# Patient Record
Sex: Female | Born: 1965 | Race: Black or African American | Hispanic: No | Marital: Single | Smoking: Never smoker
Health system: Southern US, Community
[De-identification: ages and names within clinical notes are randomized; demographics above are authoritative.]

## PROBLEM LIST (undated history)

## (undated) DIAGNOSIS — T7840XA Allergy, unspecified, initial encounter: Secondary | ICD-10-CM

## (undated) DIAGNOSIS — I10 Essential (primary) hypertension: Secondary | ICD-10-CM

## (undated) DIAGNOSIS — R87619 Unspecified abnormal cytological findings in specimens from cervix uteri: Secondary | ICD-10-CM

## (undated) HISTORY — DX: Allergy, unspecified, initial encounter: T78.40XA

## (undated) HISTORY — DX: Essential (primary) hypertension: I10

## (undated) HISTORY — DX: Unspecified abnormal cytological findings in specimens from cervix uteri: R87.619

---

## 1998-07-11 ENCOUNTER — Ambulatory Visit (HOSPITAL_COMMUNITY): Admission: RE | Admit: 1998-07-11 | Discharge: 1998-07-11 | Payer: Self-pay | Admitting: Family Medicine

## 2000-02-06 ENCOUNTER — Other Ambulatory Visit: Admission: RE | Admit: 2000-02-06 | Discharge: 2000-02-06 | Payer: Self-pay | Admitting: Gynecology

## 2001-02-21 ENCOUNTER — Other Ambulatory Visit: Admission: RE | Admit: 2001-02-21 | Discharge: 2001-02-21 | Payer: Self-pay | Admitting: Gynecology

## 2002-03-26 ENCOUNTER — Other Ambulatory Visit: Admission: RE | Admit: 2002-03-26 | Discharge: 2002-03-26 | Payer: Self-pay | Admitting: Family Medicine

## 2003-08-25 ENCOUNTER — Other Ambulatory Visit: Admission: RE | Admit: 2003-08-25 | Discharge: 2003-08-25 | Payer: Self-pay | Admitting: Family Medicine

## 2004-08-23 ENCOUNTER — Ambulatory Visit: Payer: Self-pay | Admitting: Family Medicine

## 2004-08-29 ENCOUNTER — Ambulatory Visit: Payer: Self-pay | Admitting: Family Medicine

## 2004-08-29 ENCOUNTER — Other Ambulatory Visit: Admission: RE | Admit: 2004-08-29 | Discharge: 2004-08-29 | Payer: Self-pay | Admitting: Family Medicine

## 2005-12-25 ENCOUNTER — Encounter: Payer: Self-pay | Admitting: Family Medicine

## 2005-12-25 ENCOUNTER — Ambulatory Visit: Payer: Self-pay | Admitting: Family Medicine

## 2005-12-25 ENCOUNTER — Other Ambulatory Visit: Admission: RE | Admit: 2005-12-25 | Discharge: 2005-12-25 | Payer: Self-pay | Admitting: Family Medicine

## 2008-04-08 ENCOUNTER — Telehealth: Payer: Self-pay | Admitting: Family Medicine

## 2008-04-09 ENCOUNTER — Ambulatory Visit: Payer: Self-pay | Admitting: Family Medicine

## 2008-04-09 DIAGNOSIS — I10 Essential (primary) hypertension: Secondary | ICD-10-CM | POA: Insufficient documentation

## 2008-05-26 ENCOUNTER — Telehealth: Payer: Self-pay | Admitting: Family Medicine

## 2008-06-18 ENCOUNTER — Telehealth: Payer: Self-pay | Admitting: Family Medicine

## 2008-06-22 ENCOUNTER — Ambulatory Visit: Payer: Self-pay | Admitting: Family Medicine

## 2008-11-18 ENCOUNTER — Ambulatory Visit: Payer: Self-pay | Admitting: Family Medicine

## 2008-11-18 ENCOUNTER — Other Ambulatory Visit: Admission: RE | Admit: 2008-11-18 | Discharge: 2008-11-18 | Payer: Self-pay | Admitting: Family Medicine

## 2008-11-18 ENCOUNTER — Encounter: Payer: Self-pay | Admitting: Family Medicine

## 2008-11-18 DIAGNOSIS — B373 Candidiasis of vulva and vagina: Secondary | ICD-10-CM

## 2008-11-18 DIAGNOSIS — J019 Acute sinusitis, unspecified: Secondary | ICD-10-CM

## 2008-11-26 ENCOUNTER — Encounter: Payer: Self-pay | Admitting: Family Medicine

## 2008-11-26 LAB — CONVERTED CEMR LAB
Alkaline Phosphatase: 70 units/L (ref 39–117)
Bilirubin, Direct: 0.1 mg/dL (ref 0.0–0.3)
CO2: 0 meq/L — CL (ref 19–32)
Chloride: 112 meq/L (ref 96–112)
GFR calc Af Amer: 142 mL/min
Glucose, Bld: 145 mg/dL — ABNORMAL HIGH (ref 70–99)
HDL: 55.3 mg/dL (ref >39.0)
LDL Cholesterol: 98 mg/dL (ref 0–99)
Lymphocytes Relative: 30.6 % (ref 12.0–46.0)
Monocytes Absolute: 0.5 10*3/uL (ref 0.1–1.0)
Monocytes Relative: 10.1 % (ref 3.0–12.0)
Neutrophils Relative %: 56.4 % (ref 43.0–77.0)
Platelets: 221 10*3/uL (ref 150–400)
Potassium: 3.5 meq/L (ref 3.5–5.1)
RDW: 12 % (ref 11.5–14.6)
Sodium: 142 meq/L (ref 135–145)
Total CHOL/HDL Ratio: 3.3
Total Protein: 6.8 g/dL (ref 6.0–8.3)
Triglycerides: 156 mg/dL — ABNORMAL HIGH (ref 0–149)
VLDL: 31 mg/dL (ref 0–40)

## 2009-02-03 ENCOUNTER — Telehealth: Payer: Self-pay | Admitting: Family Medicine

## 2009-02-17 ENCOUNTER — Encounter: Payer: Self-pay | Admitting: Family Medicine

## 2009-02-26 ENCOUNTER — Encounter: Payer: Self-pay | Admitting: Family Medicine

## 2009-07-18 ENCOUNTER — Encounter: Payer: Self-pay | Admitting: Family Medicine

## 2009-09-26 ENCOUNTER — Ambulatory Visit (HOSPITAL_COMMUNITY): Admission: RE | Admit: 2009-09-26 | Discharge: 2009-09-26 | Payer: Self-pay | Admitting: Orthopedic Surgery

## 2009-10-17 HISTORY — PX: FOOT SURGERY: SHX648

## 2009-11-17 ENCOUNTER — Telehealth: Payer: Self-pay | Admitting: Family Medicine

## 2009-12-23 ENCOUNTER — Ambulatory Visit: Payer: Self-pay | Admitting: Family Medicine

## 2009-12-26 LAB — CONVERTED CEMR LAB
AST: 40 units/L — ABNORMAL HIGH (ref 0–37)
Albumin: 3.4 g/dL — ABNORMAL LOW (ref 3.5–5.2)
Basophils Absolute: 0 10*3/uL (ref 0.0–0.1)
Calcium: 9.4 mg/dL (ref 8.4–10.5)
Chloride: 109 meq/L (ref 96–112)
Creatinine, Ser: 0.6 mg/dL (ref 0.4–1.2)
Eosinophils Absolute: 0.2 10*3/uL (ref 0.0–0.7)
Ketones, ur: NEGATIVE mg/dL
LDL Cholesterol: 99 mg/dL (ref 0–99)
Leukocytes, UA: NEGATIVE
Lymphs Abs: 1.9 10*3/uL (ref 0.7–4.0)
MCHC: 33.1 g/dL (ref 30.0–36.0)
MCV: 94.2 fL (ref 78.0–100.0)
Monocytes Absolute: 0.8 10*3/uL (ref 0.1–1.0)
Neutro Abs: 3.4 10*3/uL (ref 1.4–7.7)
Nitrite: NEGATIVE
Platelets: 238 10*3/uL (ref 150.0–400.0)
RBC: 3.52 M/uL — ABNORMAL LOW (ref 3.87–5.11)
RDW: 12.6 % (ref 11.5–14.6)
Specific Gravity, Urine: 1.02 (ref 1.000–1.030)
TSH: 1.91 microintl units/mL (ref 0.35–5.50)
Total Bilirubin: 0.4 mg/dL (ref 0.3–1.2)
Total CHOL/HDL Ratio: 3
Triglycerides: 115 mg/dL (ref 0.0–149.0)
Urobilinogen, UA: 0.2 (ref 0.0–1.0)
WBC: 6.3 10*3/uL (ref 4.5–10.5)
pH: 6 (ref 5.0–8.0)

## 2009-12-27 ENCOUNTER — Ambulatory Visit: Payer: Self-pay | Admitting: Family Medicine

## 2009-12-27 ENCOUNTER — Other Ambulatory Visit: Admission: RE | Admit: 2009-12-27 | Discharge: 2009-12-27 | Payer: Self-pay | Admitting: Family Medicine

## 2009-12-27 DIAGNOSIS — E119 Type 2 diabetes mellitus without complications: Secondary | ICD-10-CM | POA: Insufficient documentation

## 2009-12-28 ENCOUNTER — Encounter: Payer: Self-pay | Admitting: Family Medicine

## 2009-12-30 ENCOUNTER — Encounter: Payer: Self-pay | Admitting: Family Medicine

## 2010-03-29 ENCOUNTER — Ambulatory Visit: Payer: Self-pay | Admitting: Family Medicine

## 2010-03-30 ENCOUNTER — Encounter: Payer: Self-pay | Admitting: Family Medicine

## 2010-03-31 LAB — CONVERTED CEMR LAB
Calcium: 10.1 mg/dL (ref 8.4–10.5)
Creatinine,U: 83.3 mg/dL
GFR calc non Af Amer: 99.54 mL/min (ref >60)
Glucose, Bld: 155 mg/dL — ABNORMAL HIGH (ref 70–99)
Hgb A1c MFr Bld: 6.4 % (ref 4.6–6.5)
Potassium: 4.5 meq/L (ref 3.5–5.1)
Sodium: 137 meq/L (ref 135–145)

## 2010-11-16 NOTE — Medication Information (Signed)
Summary: Order for Compression Stockings  Order for Compression Stockings   Imported By: Maryln Gottron 04/03/2010 13:44:59  _____________________________________________________________________  External Attachment:    Type:   Image     Comment:   External Document

## 2010-11-16 NOTE — Assessment & Plan Note (Signed)
Summary: cpx/cjr   Vital Signs:  Patient profile:   45 year old female Weight:      268 pounds BMI:     42.13 Pulse rate:   80 / minute Pulse rhythm:   regular BP sitting:   142 / 104  (left arm) Cuff size:   large  Vitals Entered By: Raechel Ache, RN (December 27, 2009 10:22 AM) CC: CPX, labs done.   History of Present Illness: 45 yr old female for a cpx. We diagnosed her with early type 2 DM last year, and we reccomended diet and exercise at that time. She had foot surgery a few months ago, so exercise has been difficult. She has put on a little more weight. She feels fine in general.   Preventive Screening-Counseling & Management  Alcohol-Tobacco     Smoking Status: never  Allergies (verified): No Known Drug Allergies  Past History:  Past Medical History: Hypertension Diabetes mellitus, type II  Past Surgical History: surgery to remove a fractured sesamoid bone form left foot per Dr. Milly Jakob 10-17-09  Family History: Reviewed history from 04/09/2008 and no changes required. Family History Hypertension  Social History: Reviewed history from 04/09/2008 and no changes required. Single Never Smoked Alcohol use-yes  Review of Systems  The patient denies anorexia, fever, weight loss, vision loss, decreased hearing, hoarseness, chest pain, syncope, dyspnea on exertion, peripheral edema, prolonged cough, headaches, hemoptysis, abdominal pain, melena, hematochezia, severe indigestion/heartburn, hematuria, incontinence, genital sores, muscle weakness, suspicious skin lesions, transient blindness, difficulty walking, depression, unusual weight change, abnormal bleeding, enlarged lymph nodes, angioedema, breast masses, and testicular masses.    Physical Exam  General:  overweight-appearing.   Head:  Normocephalic and atraumatic without obvious abnormalities. No apparent alopecia or balding. Eyes:  No corneal or conjunctival inflammation noted. EOMI. Perrla.  Funduscopic exam benign, without hemorrhages, exudates or papilledema. Vision grossly normal. Ears:  External ear exam shows no significant lesions or deformities.  Otoscopic examination reveals clear canals, tympanic membranes are intact bilaterally without bulging, retraction, inflammation or discharge. Hearing is grossly normal bilaterally. Nose:  External nasal examination shows no deformity or inflammation. Nasal mucosa are pink and moist without lesions or exudates. Mouth:  Oral mucosa and oropharynx without lesions or exudates.  Teeth in good repair. Neck:  No deformities, masses, or tenderness noted.   Impression & Recommendations:  Problem # 1:  WELL ADULT EXAM (ICD-V70.0)  Complete Medication List: 1)  Amlodipine Besylate 10 Mg Tabs (Amlodipine besylate) .... Once daily 2)  Lisinopril-hydrochlorothiazide 20-12.5 Mg Tabs (Lisinopril-hydrochlorothiazide) .... Two times a day 3)  Metformin Hcl 500 Mg Tabs (Metformin hcl) .... Two times a day  Patient Instructions: 1)  we will change her HTN meds to all generics where possible, and since she is diabetic we will switch her to an ACE inhibitor. Add Metformin. 2)  It is important that you exercise reguarly at least 20 minutes 5 times a week. If you develop chest pain, have severe difficulty breathing, or feel very tired, stop exercising immediately and seek medical attention.  3)  You need to lose weight. Consider a lower calorie diet and regular exercise.  4)  Please schedule a follow-up appointment in 3 months .  Prescriptions: METFORMIN HCL 500 MG TABS (METFORMIN HCL) two times a day  #60 x 11   Entered and Authorized by:   Nelwyn Salisbury MD   Signed by:   Nelwyn Salisbury MD on 12/27/2009   Method used:   Electronically  to        [facility] #[id number]* (retail)       [address] [place], [place]  [address]       Ph: [phone]       Fax: [phone]   RxID:   [id number] AMLODIPINE BESYLATE 10 MG  TABS  (AMLODIPINE BESYLATE) once daily  #30 x 11   Entered and Authorized by:   Nelwyn Salisbury MD   Signed by:   Nelwyn Salisbury MD on 12/27/2009   Method used:   Electronically to        [facility] #[id number]* (retail)       [address] [place], [place]  [address]       Ph: [phone]       Fax: [phone]   RxID:   [id number] LISINOPRIL-HYDROCHLOROTHIAZIDE 20-12.5 MG TABS (LISINOPRIL-HYDROCHLOROTHIAZIDE) two times a day  #60 x 11   Entered and Authorized by:   Nelwyn Salisbury MD   Signed by:   Nelwyn Salisbury MD on 12/27/2009   Method used:   Electronically to        [facility] #[id number]* (retail)       [address] [place], [place]  [address]       Ph: [phone]       Fax: [phone]   RxID:   [id number]

## 2010-11-16 NOTE — Progress Notes (Signed)
Summary: Pt req refills for Diovan HCT and Amlodipine Besylate  Phone Note Call from Patient Call back at [phone] cell   Caller: Patient Summary of Call: Pt has changed insurance and their pharmacy has changed to [facility] on [address]. Pt req script for Diovan HCT 160-12.5 mg and Amlodipine Besylate 10mg .   Pt has sch a cpx with labs prior in March 2011.  Initial call taken by: Lucy Antigua,  November 17, 2009 10:05 AM  Follow-up for Phone Call        Phone Call Completed, Rx Called In Follow-up by: Alfred Levins, CMA,  November 17, 2009 10:15 AM    Prescriptions: AMLODIPINE BESYLATE 10 MG  TABS (AMLODIPINE BESYLATE) once daily  #30 x 0   Entered by:   Alfred Levins, CMA   Authorized by:   Nelwyn Salisbury MD   Signed by:   Alfred Levins, CMA on 11/17/2009   Method used:   Electronically to        [facility] #[id number]* (retail)       [address] [place], [place]  [address]       Ph: [phone]       Fax: [phone]   RxID:   [id number] DIOVAN HCT 160-12.5 MG  TABS (VALSARTAN-HYDROCHLOROTHIAZIDE) one by mouth every morning  #30 x 0   Entered by:   Alfred Levins, CMA   Authorized by:   Nelwyn Salisbury MD   Signed by:   Alfred Levins, CMA on 11/17/2009   Method used:   Electronically to        [facility] #[id number]* (retail)       [address] [place], [place]  [address]       Ph: [phone]       Fax: [phone]   RxID:   [id number]

## 2010-11-16 NOTE — Assessment & Plan Note (Signed)
Summary: 6 MTH ROV // RS   Vital Signs:  Patient profile:   45 year old female Weight:      246 pounds BMI:     38.67 BP sitting:   116 / 84  (left arm) Cuff size:   large  Vitals Entered By: Raechel Ache, RN (March 29, 2010 10:56 AM) CC: 6 mo ROV.   History of Present Illness: Here to follow up on DM and HTN. She has made some big dietary changes and is walking daily, and she has lost 22 lbs. She feels good.   Allergies: No Known Drug Allergies  Past History:  Past Medical History: Reviewed history from 12/27/2009 and no changes required. Hypertension Diabetes mellitus, type II  Review of Systems  The patient denies anorexia, fever, weight gain, vision loss, decreased hearing, hoarseness, chest pain, syncope, dyspnea on exertion, peripheral edema, prolonged cough, headaches, hemoptysis, abdominal pain, melena, hematochezia, severe indigestion/heartburn, hematuria, incontinence, genital sores, muscle weakness, suspicious skin lesions, transient blindness, difficulty walking, depression, unusual weight change, abnormal bleeding, enlarged lymph nodes, angioedema, breast masses, and testicular masses.    Physical Exam  General:  overweight-appearing.   Neck:  No deformities, masses, or tenderness noted. Lungs:  Normal respiratory effort, chest expands symmetrically. Lungs are clear to auscultation, no crackles or wheezes. Heart:  Normal rate and regular rhythm. S1 and S2 normal without gallop, murmur, click, rub or other extra sounds.   Impression & Recommendations:  Problem # 1:  DIABETES MELLITUS, TYPE II (ICD-250.00)  Her updated medication list for this problem includes:    Lisinopril-hydrochlorothiazide 20-12.5 Mg Tabs (Lisinopril-hydrochlorothiazide) .Marland Kitchen..Marland Kitchen Two times a day    Metformin Hcl 500 Mg Tabs (Metformin hcl) .Marland Kitchen..Marland Kitchen Two times a day  Orders: Venipuncture ([address]) TLB-BMP (Basic Metabolic Panel-BMET) (80048-METABOL) TLB-A1C / Hgb A1C (Glycohemoglobin)  (83036-A1C) TLB-Microalbumin/Creat Ratio, Urine (82043-MALB)  Problem # 2:  HYPERTENSION (ICD-401.9)  Her updated medication list for this problem includes:    Amlodipine Besylate 10 Mg Tabs (Amlodipine besylate) ..... Once daily    Lisinopril-hydrochlorothiazide 20-12.5 Mg Tabs (Lisinopril-hydrochlorothiazide) .Marland Kitchen..Marland Kitchen Two times a day  Complete Medication List: 1)  Amlodipine Besylate 10 Mg Tabs (Amlodipine besylate) .... Once daily 2)  Lisinopril-hydrochlorothiazide 20-12.5 Mg Tabs (Lisinopril-hydrochlorothiazide) .... Two times a day 3)  Metformin Hcl 500 Mg Tabs (Metformin hcl) .... Two times a day  Patient Instructions: 1)  Get labs today.

## 2010-11-16 NOTE — Letter (Signed)
Summary: Generic Letter  [place] [facility]  [address] [place], [place] [address]   Phone: [phone]  Fax: [phone]    12/30/2009  Jasmine Matthews [address] [place], [place]  [address]  Dear Ms. [name],       Your Pap smear is normal.        Sincerely,   Tomma Lightning, RN

## 2011-01-12 ENCOUNTER — Encounter: Payer: Self-pay | Admitting: Family Medicine

## 2011-03-01 ENCOUNTER — Other Ambulatory Visit: Payer: Self-pay | Admitting: Family Medicine

## 2011-03-08 ENCOUNTER — Encounter: Payer: Self-pay | Admitting: Family Medicine

## 2011-03-08 ENCOUNTER — Other Ambulatory Visit (HOSPITAL_COMMUNITY)
Admission: RE | Admit: 2011-03-08 | Discharge: 2011-03-08 | Disposition: A | Discharge status: Home / Self Care | Payer: BC Managed Care – PPO | Source: Ambulatory Visit | Attending: Family Medicine | Admitting: Family Medicine

## 2011-03-08 ENCOUNTER — Ambulatory Visit (INDEPENDENT_AMBULATORY_CARE_PROVIDER_SITE_OTHER): Payer: BC Managed Care – PPO | Admitting: Family Medicine

## 2011-03-08 VITALS — BP 132/88 | HR 76 | Temp 98.4°F | Resp 16 | Ht 66.75 in | Wt 242.0 lb

## 2011-03-08 DIAGNOSIS — E119 Type 2 diabetes mellitus without complications: Secondary | ICD-10-CM

## 2011-03-08 DIAGNOSIS — Z01419 Encounter for gynecological examination (general) (routine) without abnormal findings: Secondary | ICD-10-CM | POA: Insufficient documentation

## 2011-03-08 DIAGNOSIS — Z0000 Encounter for general adult medical examination without abnormal findings: Secondary | ICD-10-CM

## 2011-03-08 LAB — BASIC METABOLIC PANEL
CO2: 25 mEq/L (ref 19–32)
Calcium: 9.6 mg/dL (ref 8.4–10.5)
GFR: 123.36 mL/min (ref >60.00)
Potassium: 4.4 mEq/L (ref 3.5–5.1)
Sodium: 139 mEq/L (ref 135–145)

## 2011-03-08 LAB — POCT URINALYSIS DIPSTICK
Bilirubin, UA: NEGATIVE
Blood, UA: NEGATIVE
Glucose, UA: NEGATIVE
Ketones, UA: NEGATIVE
Spec Grav, UA: 1.02

## 2011-03-08 LAB — CBC WITH DIFFERENTIAL/PLATELET
Basophils Relative: 0.5 % (ref 0.0–3.0)
Eosinophils Relative: 1.5 % (ref 0.0–5.0)
HCT: 34.3 % — ABNORMAL LOW (ref 36.0–46.0)
Hemoglobin: 11.7 g/dL — ABNORMAL LOW (ref 12.0–15.0)
Lymphs Abs: 1.5 10*3/uL (ref 0.7–4.0)
MCV: 95.8 fl (ref 78.0–100.0)
Monocytes Absolute: 0.5 10*3/uL (ref 0.1–1.0)
Neutro Abs: 3.7 10*3/uL (ref 1.4–7.7)
Neutrophils Relative %: 64.2 % (ref 43.0–77.0)
RBC: 3.58 Mil/uL — ABNORMAL LOW (ref 3.87–5.11)
WBC: 5.8 10*3/uL (ref 4.5–10.5)

## 2011-03-08 LAB — LIPID PANEL
HDL: 60.9 mg/dL (ref >39.00)
Total CHOL/HDL Ratio: 3
VLDL: 22 mg/dL (ref 0.0–40.0)

## 2011-03-08 LAB — HEPATIC FUNCTION PANEL
AST: 17 U/L (ref 0–37)
Alkaline Phosphatase: 64 U/L (ref 39–117)
Bilirubin, Direct: 0.1 mg/dL (ref 0.0–0.3)

## 2011-03-08 MED ORDER — AMLODIPINE BESYLATE 10 MG PO TABS: 10.0000 mg | ORAL_TABLET | Freq: Every day | ORAL | Status: DC

## 2011-03-08 MED ORDER — METFORMIN HCL 500 MG PO TABS: 500.0000 mg | ORAL_TABLET | Freq: Two times a day (BID) | ORAL | Status: DC

## 2011-03-08 MED ORDER — LISINOPRIL-HYDROCHLOROTHIAZIDE 20-12.5 MG PO TABS: 1.0000 | ORAL_TABLET | Freq: Two times a day (BID) | ORAL | Status: DC

## 2011-03-08 NOTE — Progress Notes (Signed)
Subjective:    Patient ID: Jasmine Matthews, female    DOB: [birth year], 45 y.o.   MRN: [id number]  HPI 45 yr old female for a cpx. She feels fine and has no concerns. Watching her diet and exercising. She has lost 26 lbs in the past year.  Review of Systems  Constitutional: Negative.  Negative for fever, diaphoresis, activity change, appetite change, fatigue and unexpected weight change.  HENT: Negative.  Negative for hearing loss, ear pain, nosebleeds, congestion, sore throat, trouble swallowing, neck pain, neck stiffness, voice change and tinnitus.   Eyes: Negative.  Negative for photophobia, pain, discharge, redness and visual disturbance.  Respiratory: Negative.  Negative for apnea, cough, choking, chest tightness, shortness of breath, wheezing and stridor.   Cardiovascular: Negative.  Negative for chest pain, palpitations and leg swelling.  Gastrointestinal: Negative.  Negative for nausea, vomiting, abdominal pain, diarrhea, constipation, blood in stool, abdominal distention and rectal pain.  Genitourinary: Negative.  Negative for dysuria, urgency, frequency, hematuria, flank pain, scrotal swelling, vaginal bleeding, vaginal discharge, enuresis, difficulty urinating, testicular pain, vaginal pain and menstrual problem.  Musculoskeletal: Negative.  Negative for myalgias, back pain, joint swelling, arthralgias and gait problem.  Skin: Negative.  Negative for color change, pallor, rash and wound.  Neurological: Negative.  Negative for dizziness, tremors, seizures, syncope, speech difficulty, weakness, light-headedness, numbness and headaches.  Hematological: Negative.  Negative for adenopathy. Does not bruise/bleed easily.  Psychiatric/Behavioral: Negative.  Negative for hallucinations, behavioral problems, confusion, sleep disturbance, dysphoric mood and agitation. The patient is not nervous/anxious.        Objective:   Physical Exam  Constitutional: She appears well-developed and  well-nourished. No distress.  HENT:  Head: Normocephalic and atraumatic.  Right Ear: External ear normal.  Left Ear: External ear normal.  Nose: Nose normal.  Mouth/Throat: Oropharynx is clear and moist. No oropharyngeal exudate.  Eyes: Conjunctivae and EOM are normal. Pupils are equal, round, and reactive to light. Right eye exhibits no discharge. Left eye exhibits no discharge. No scleral icterus.  Neck: Normal range of motion. Neck supple. No JVD present. No thyromegaly present.  Cardiovascular: Normal rate, regular rhythm, normal heart sounds and intact distal pulses.  Exam reveals no gallop and no friction rub.   No murmur heard. Pulmonary/Chest: Effort normal and breath sounds normal. No stridor. No respiratory distress. She has no wheezes. She has no rales. She exhibits no tenderness.  Abdominal: Soft. Normal appearance and bowel sounds are normal. She exhibits no distension, no abdominal bruit, no ascites and no mass. There is no hepatosplenomegaly. There is no tenderness. There is no rigidity, no rebound and no guarding. No hernia.  Genitourinary: Rectum normal, vagina normal and uterus normal. No breast swelling, tenderness, discharge or bleeding. Cervix exhibits no motion tenderness, no discharge and no friability. Right adnexum displays no mass, no tenderness and no fullness. Left adnexum displays no mass, no tenderness and no fullness. No erythema, tenderness or bleeding around the vagina. No vaginal discharge found.  Musculoskeletal: Normal range of motion. She exhibits no edema and no tenderness.  Lymphadenopathy:    She has no cervical adenopathy.  Neurological: She is alert. She has normal reflexes. No cranial nerve deficit. She exhibits normal muscle tone. Coordination normal.  Skin: Skin is warm and dry. No rash noted. She is not diaphoretic. No erythema. No pallor.  Psychiatric: She has a normal mood and affect. Her behavior is normal. Judgment and thought content normal.  Assessment & Plan:  Get fasting labs today

## 2011-03-08 NOTE — Progress Notes (Signed)
Addended by: Burnard Leigh on: 03/08/2011 11:32 AM   Modules accepted: Orders

## 2011-03-20 NOTE — Progress Notes (Signed)
Attempted to contact patient at [phone], no answer, no voice mail 

## 2011-03-20 NOTE — Progress Notes (Signed)
Attempted to contact patient at [phone], no answer, no voice mail

## 2011-03-26 ENCOUNTER — Encounter: Payer: Self-pay | Admitting: *Deleted

## 2011-07-18 ENCOUNTER — Ambulatory Visit: Payer: BC Managed Care – PPO | Admitting: Family Medicine

## 2011-10-31 ENCOUNTER — Ambulatory Visit (INDEPENDENT_AMBULATORY_CARE_PROVIDER_SITE_OTHER): Payer: BC Managed Care – PPO | Admitting: Family Medicine

## 2011-10-31 ENCOUNTER — Encounter: Payer: Self-pay | Admitting: Family Medicine

## 2011-10-31 VITALS — BP 132/84 | HR 103 | Temp 98.5°F | Wt 240.0 lb

## 2011-10-31 DIAGNOSIS — N760 Acute vaginitis: Secondary | ICD-10-CM

## 2011-10-31 DIAGNOSIS — B9689 Other specified bacterial agents as the cause of diseases classified elsewhere: Secondary | ICD-10-CM

## 2011-10-31 DIAGNOSIS — R350 Frequency of micturition: Secondary | ICD-10-CM

## 2011-10-31 DIAGNOSIS — A499 Bacterial infection, unspecified: Secondary | ICD-10-CM

## 2011-10-31 LAB — POCT URINALYSIS DIPSTICK
Ketones, UA: NEGATIVE
pH, UA: 6.5

## 2011-10-31 MED ORDER — METRONIDAZOLE 500 MG PO TABS: 500.0000 mg | ORAL_TABLET | Freq: Three times a day (TID) | ORAL | Status: AC

## 2011-10-31 NOTE — Progress Notes (Signed)
  Subjective:    Patient ID: Jasmine Matthews, female    DOB: 1966/06/12, 46 y.o.   MRN: [id number]  HPI Here for 10 days of a yellowish vaginal DC with some itching and a foul odor. No pain or fever. She tried 7 days of Monistat with no results. Her LMP was 10-05-11.    Review of Systems  Constitutional: Negative.   Gastrointestinal: Negative.   Genitourinary: Positive for vaginal discharge. Negative for dysuria, hematuria, vaginal bleeding, difficulty urinating, vaginal pain and pelvic pain.       Objective:   Physical Exam  Constitutional: She appears well-developed and well-nourished.  Abdominal: Soft. Bowel sounds are normal. She exhibits no distension and no mass. There is no tenderness. There is no rebound and no guarding.          Assessment & Plan:  Recheck prn

## 2012-01-28 ENCOUNTER — Encounter: Payer: Self-pay | Admitting: Family Medicine

## 2012-02-29 ENCOUNTER — Other Ambulatory Visit: Payer: Self-pay | Admitting: Family Medicine

## 2012-03-21 ENCOUNTER — Other Ambulatory Visit: Payer: Self-pay | Admitting: Family Medicine

## 2013-03-17 ENCOUNTER — Encounter: Payer: Self-pay | Admitting: Family Medicine

## 2013-03-17 ENCOUNTER — Ambulatory Visit (INDEPENDENT_AMBULATORY_CARE_PROVIDER_SITE_OTHER): Payer: BC Managed Care – PPO | Admitting: Family Medicine

## 2013-03-17 VITALS — BP 142/88 | HR 88 | Temp 98.3°F | Wt 234.0 lb

## 2013-03-17 DIAGNOSIS — J029 Acute pharyngitis, unspecified: Secondary | ICD-10-CM

## 2013-03-17 MED ORDER — AZITHROMYCIN 250 MG PO TABS: ORAL_TABLET | ORAL | Status: DC

## 2013-03-17 NOTE — Progress Notes (Signed)
  Subjective:    Patient ID: Jasmine Matthews, female    DOB: [birth year], 47 y.o.   MRN: [id number]  HPI Here for 4 days of a ST and swollen nodes in the neck. No fever or cough.    Review of Systems  Constitutional: Negative.   HENT: Positive for congestion, postnasal drip and sinus pressure.   Eyes: Negative.   Respiratory: Negative.        Objective:   Physical Exam  Constitutional: She appears well-developed and well-nourished.  HENT:  Right Ear: External ear normal.  Left Ear: External ear normal.  Nose: Nose normal.  Mouth/Throat: Oropharynx is clear and moist. No oropharyngeal exudate.  Eyes: Conjunctivae are normal.  Neck:  Tender AC nodes   Pulmonary/Chest: Effort normal and breath sounds normal.          Assessment & Plan:  Recheck prn

## 2013-07-03 ENCOUNTER — Other Ambulatory Visit: Payer: Self-pay | Admitting: Family Medicine

## 2013-07-03 NOTE — Telephone Encounter (Signed)
Can we refill these? 

## 2013-07-16 ENCOUNTER — Other Ambulatory Visit (INDEPENDENT_AMBULATORY_CARE_PROVIDER_SITE_OTHER): Payer: BC Managed Care – PPO

## 2013-07-16 DIAGNOSIS — Z0000 Encounter for general adult medical examination without abnormal findings: Secondary | ICD-10-CM

## 2013-07-16 LAB — POCT URINALYSIS DIPSTICK
Bilirubin, UA: NEGATIVE
Glucose, UA: NEGATIVE
Leukocytes, UA: NEGATIVE
Nitrite, UA: NEGATIVE

## 2013-07-16 LAB — CBC WITH DIFFERENTIAL/PLATELET
Basophils Absolute: 0 10*3/uL (ref 0.0–0.1)
Eosinophils Relative: 1.5 % (ref 0.0–5.0)
HCT: 37.2 % (ref 36.0–46.0)
Hemoglobin: 12.4 g/dL (ref 12.0–15.0)
Lymphs Abs: 2.1 10*3/uL (ref 0.7–4.0)
Monocytes Relative: 10 % (ref 3.0–12.0)
Neutro Abs: 2.6 10*3/uL (ref 1.4–7.7)
RDW: 12.9 % (ref 11.5–14.6)

## 2013-07-17 LAB — HEPATIC FUNCTION PANEL
Bilirubin, Direct: 0.1 mg/dL (ref 0.0–0.3)
Total Bilirubin: 0.7 mg/dL (ref 0.3–1.2)
Total Protein: 7.2 g/dL (ref 6.0–8.3)

## 2013-07-17 LAB — BASIC METABOLIC PANEL
BUN: 21 mg/dL (ref 6–23)
CO2: 25 mEq/L (ref 19–32)
Calcium: 9.7 mg/dL (ref 8.4–10.5)
Creatinine, Ser: 0.7 mg/dL (ref 0.4–1.2)
Glucose, Bld: 108 mg/dL — ABNORMAL HIGH (ref 70–99)

## 2013-07-17 LAB — LIPID PANEL: Total CHOL/HDL Ratio: 3

## 2013-07-17 NOTE — Progress Notes (Signed)
Quick Note:  Pt has appointment on 07/23/13 will go over then. ______

## 2013-07-23 ENCOUNTER — Ambulatory Visit (INDEPENDENT_AMBULATORY_CARE_PROVIDER_SITE_OTHER): Payer: BC Managed Care – PPO | Admitting: Family Medicine

## 2013-07-23 ENCOUNTER — Encounter: Payer: BC Managed Care – PPO | Admitting: Family Medicine

## 2013-07-23 ENCOUNTER — Encounter: Payer: Self-pay | Admitting: Family Medicine

## 2013-07-23 VITALS — BP 126/88 | HR 76 | Temp 98.8°F | Ht 66.75 in | Wt 234.0 lb

## 2013-07-23 DIAGNOSIS — Z0000 Encounter for general adult medical examination without abnormal findings: Secondary | ICD-10-CM

## 2013-07-23 DIAGNOSIS — E119 Type 2 diabetes mellitus without complications: Secondary | ICD-10-CM

## 2013-07-23 LAB — HEMOGLOBIN A1C: Hgb A1c MFr Bld: 6.7 % — ABNORMAL HIGH (ref 4.6–6.5)

## 2013-07-23 MED ORDER — METFORMIN HCL 500 MG PO TABS: ORAL_TABLET | ORAL | Status: DC

## 2013-07-23 MED ORDER — AMLODIPINE BESYLATE 10 MG PO TABS: ORAL_TABLET | ORAL | Status: DC

## 2013-07-23 MED ORDER — LISINOPRIL-HYDROCHLOROTHIAZIDE 20-12.5 MG PO TABS: ORAL_TABLET | ORAL | Status: DC

## 2013-07-23 NOTE — Progress Notes (Signed)
  Subjective:    Patient ID: Jasmine Matthews, female    DOB: 01-07-1966, 47 y.o.   MRN: [id number]  HPI 47 yr old female for a cpx. She feels well. She is working to lose weight.    Review of Systems  Constitutional: Negative.   HENT: Negative.   Eyes: Negative.   Respiratory: Negative.   Cardiovascular: Negative.   Gastrointestinal: Negative.   Genitourinary: Negative for dysuria, urgency, frequency, hematuria, flank pain, decreased urine volume, enuresis, difficulty urinating, pelvic pain and dyspareunia.  Musculoskeletal: Negative.   Skin: Negative.   Neurological: Negative.   Psychiatric/Behavioral: Negative.        Objective:   Physical Exam  Constitutional: She is oriented to person, place, and time. She appears well-developed and well-nourished. No distress.  HENT:  Head: Normocephalic and atraumatic.  Right Ear: External ear normal.  Left Ear: External ear normal.  Nose: Nose normal.  Mouth/Throat: Oropharynx is clear and moist. No oropharyngeal exudate.  Eyes: Conjunctivae and EOM are normal. Pupils are equal, round, and reactive to light. No scleral icterus.  Neck: Normal range of motion. Neck supple. No JVD present. No thyromegaly present.  Cardiovascular: Normal rate, regular rhythm, normal heart sounds and intact distal pulses.  Exam reveals no gallop and no friction rub.   No murmur heard. Pulmonary/Chest: Effort normal and breath sounds normal. No respiratory distress. She has no wheezes. She has no rales. She exhibits no tenderness.  Abdominal: Soft. Bowel sounds are normal. She exhibits no distension and no mass. There is no tenderness. There is no rebound and no guarding.  Musculoskeletal: Normal range of motion. She exhibits no edema and no tenderness.  Lymphadenopathy:    She has no cervical adenopathy.  Neurological: She is alert and oriented to person, place, and time. She has normal reflexes. No cranial nerve deficit. She exhibits normal muscle tone.  Coordination normal.  Skin: Skin is warm and dry. No rash noted. No erythema.  Psychiatric: She has a normal mood and affect. Her behavior is normal. Judgment and thought content normal.          Assessment & Plan:  Well exam. Get an A1c today

## 2013-07-27 NOTE — Progress Notes (Signed)
Quick Note:  I left voice message with results. ______ 

## 2014-02-16 ENCOUNTER — Encounter: Payer: Self-pay | Admitting: Family Medicine

## 2014-07-22 ENCOUNTER — Ambulatory Visit: Payer: BC Managed Care – PPO | Admitting: Family Medicine

## 2014-08-31 ENCOUNTER — Other Ambulatory Visit (INDEPENDENT_AMBULATORY_CARE_PROVIDER_SITE_OTHER): Payer: BC Managed Care – PPO

## 2014-08-31 DIAGNOSIS — Z0000 Encounter for general adult medical examination without abnormal findings: Secondary | ICD-10-CM

## 2014-08-31 LAB — BASIC METABOLIC PANEL
BUN: 22 mg/dL (ref 6–23)
CALCIUM: 9.5 mg/dL (ref 8.4–10.5)
CHLORIDE: 106 meq/L (ref 96–112)
CO2: 22 mEq/L (ref 19–32)
CREATININE: 0.7 mg/dL (ref 0.4–1.2)
GFR: 110 mL/min (ref >60.00)
Glucose, Bld: 84 mg/dL (ref 70–99)
Potassium: 3.7 mEq/L (ref 3.5–5.1)
Sodium: 139 mEq/L (ref 135–145)

## 2014-08-31 LAB — CBC WITH DIFFERENTIAL/PLATELET
Basophils Absolute: 0 10*3/uL (ref 0.0–0.1)
Basophils Relative: 0.8 % (ref 0.0–3.0)
EOS ABS: 0.1 10*3/uL (ref 0.0–0.7)
EOS PCT: 1.9 % (ref 0.0–5.0)
HCT: 37.2 % (ref 36.0–46.0)
Hemoglobin: 12.1 g/dL (ref 12.0–15.0)
LYMPHS PCT: 39.2 % (ref 12.0–46.0)
Lymphs Abs: 2.1 10*3/uL (ref 0.7–4.0)
MCHC: 32.5 g/dL (ref 30.0–36.0)
MCV: 94.6 fl (ref 78.0–100.0)
MONO ABS: 0.6 10*3/uL (ref 0.1–1.0)
Monocytes Relative: 11.8 % (ref 3.0–12.0)
NEUTROS PCT: 46.3 % (ref 43.0–77.0)
Neutro Abs: 2.4 10*3/uL (ref 1.4–7.7)
PLATELETS: 300 10*3/uL (ref 150.0–400.0)
RBC: 3.93 Mil/uL (ref 3.87–5.11)
RDW: 13.1 % (ref 11.5–15.5)
WBC: 5.3 10*3/uL (ref 4.0–10.5)

## 2014-08-31 LAB — POCT URINALYSIS DIPSTICK
Bilirubin, UA: NEGATIVE
Blood, UA: NEGATIVE
GLUCOSE UA: NEGATIVE
KETONES UA: NEGATIVE
Leukocytes, UA: NEGATIVE
Nitrite, UA: NEGATIVE
Protein, UA: NEGATIVE
SPEC GRAV UA: 1.02
Urobilinogen, UA: 1
pH, UA: 6

## 2014-08-31 LAB — LIPID PANEL
CHOLESTEROL: 217 mg/dL — AB (ref 0–200)
HDL: 63.8 mg/dL (ref >39.00)
LDL Cholesterol: 126 mg/dL — ABNORMAL HIGH (ref 0–99)
NonHDL: 153.2
TRIGLYCERIDES: 137 mg/dL (ref 0.0–149.0)
Total CHOL/HDL Ratio: 3
VLDL: 27.4 mg/dL (ref 0.0–40.0)

## 2014-08-31 LAB — HEPATIC FUNCTION PANEL
ALK PHOS: 63 U/L (ref 39–117)
ALT: 16 U/L (ref 0–35)
AST: 16 U/L (ref 0–37)
Albumin: 3.9 g/dL (ref 3.5–5.2)
BILIRUBIN DIRECT: 0 mg/dL (ref 0.0–0.3)
TOTAL PROTEIN: 7.6 g/dL (ref 6.0–8.3)
Total Bilirubin: 0.3 mg/dL (ref 0.2–1.2)

## 2014-08-31 LAB — TSH: TSH: 1.84 u[IU]/mL (ref 0.35–4.50)

## 2014-08-31 LAB — HEMOGLOBIN A1C: Hgb A1c MFr Bld: 6.2 % (ref 4.6–6.5)

## 2014-09-07 ENCOUNTER — Other Ambulatory Visit (HOSPITAL_COMMUNITY)
Admission: RE | Admit: 2014-09-07 | Discharge: 2014-09-07 | Disposition: A | Discharge status: Home / Self Care | Payer: BC Managed Care – PPO | Source: Ambulatory Visit | Attending: Family Medicine | Admitting: Family Medicine

## 2014-09-07 ENCOUNTER — Encounter: Payer: Self-pay | Admitting: Family Medicine

## 2014-09-07 ENCOUNTER — Ambulatory Visit (INDEPENDENT_AMBULATORY_CARE_PROVIDER_SITE_OTHER): Payer: BC Managed Care – PPO | Admitting: Family Medicine

## 2014-09-07 VITALS — BP 140/88 | HR 89 | Temp 98.4°F | Ht 66.75 in | Wt 238.0 lb

## 2014-09-07 DIAGNOSIS — R87810 Cervical high risk human papillomavirus (HPV) DNA test positive: Secondary | ICD-10-CM | POA: Diagnosis present

## 2014-09-07 DIAGNOSIS — Z01411 Encounter for gynecological examination (general) (routine) with abnormal findings: Secondary | ICD-10-CM | POA: Insufficient documentation

## 2014-09-07 DIAGNOSIS — Z1151 Encounter for screening for human papillomavirus (HPV): Secondary | ICD-10-CM | POA: Diagnosis present

## 2014-09-07 DIAGNOSIS — Z0000 Encounter for general adult medical examination without abnormal findings: Secondary | ICD-10-CM

## 2014-09-07 MED ORDER — LISINOPRIL-HYDROCHLOROTHIAZIDE 20-12.5 MG PO TABS: ORAL_TABLET | ORAL | Status: DC

## 2014-09-07 MED ORDER — AMLODIPINE BESYLATE 10 MG PO TABS: ORAL_TABLET | ORAL | Status: DC

## 2014-09-07 MED ORDER — METFORMIN HCL 500 MG PO TABS: ORAL_TABLET | ORAL | Status: DC

## 2014-09-07 NOTE — Progress Notes (Signed)
Pre visit review using our clinic review tool, if applicable. No additional management support is needed unless otherwise documented below in the visit note. 

## 2014-09-07 NOTE — Progress Notes (Signed)
   Subjective:    Patient ID: Jasmine Matthews, female    DOB: October 01, 1966, 48 y.o.   MRN: [id number]  HPI 48 yr old female for a cpx. She feels fine. Her glucoses are stable.    Review of Systems  Constitutional: Negative.  Negative for fever, diaphoresis, activity change, appetite change, fatigue and unexpected weight change.  HENT: Negative.  Negative for congestion, ear pain, hearing loss, nosebleeds, sore throat, tinnitus, trouble swallowing and voice change.   Eyes: Negative.  Negative for photophobia, pain, discharge, redness and visual disturbance.  Respiratory: Negative.  Negative for apnea, cough, choking, chest tightness, shortness of breath, wheezing and stridor.   Cardiovascular: Negative.  Negative for chest pain, palpitations and leg swelling.  Gastrointestinal: Negative.  Negative for nausea, vomiting, abdominal pain, diarrhea, constipation, blood in stool, abdominal distention and rectal pain.  Genitourinary: Negative.  Negative for dysuria, urgency, frequency, hematuria, flank pain, vaginal bleeding, vaginal discharge, enuresis, difficulty urinating, vaginal pain and menstrual problem.  Musculoskeletal: Negative.  Negative for myalgias, back pain, joint swelling, arthralgias, gait problem, neck pain and neck stiffness.  Skin: Negative.  Negative for color change, pallor, rash and wound.  Neurological: Negative.  Negative for dizziness, tremors, seizures, syncope, speech difficulty, weakness, light-headedness, numbness and headaches.  Hematological: Negative for adenopathy. Does not bruise/bleed easily.  Psychiatric/Behavioral: Negative.  Negative for hallucinations, behavioral problems, confusion, sleep disturbance, dysphoric mood and agitation. The patient is not nervous/anxious.        Objective:   Physical Exam  Constitutional: She appears well-developed and well-nourished. No distress.  HENT:  Head: Normocephalic and atraumatic.  Right Ear: External ear normal.  Left  Ear: External ear normal.  Nose: Nose normal.  Mouth/Throat: Oropharynx is clear and moist. No oropharyngeal exudate.  Eyes: Conjunctivae and EOM are normal. Pupils are equal, round, and reactive to light. Right eye exhibits no discharge. Left eye exhibits no discharge. No scleral icterus.  Neck: Normal range of motion. Neck supple. No JVD present. No thyromegaly present.  Cardiovascular: Normal rate, regular rhythm, normal heart sounds and intact distal pulses.  Exam reveals no gallop and no friction rub.   No murmur heard. Pulmonary/Chest: Effort normal and breath sounds normal. No stridor. No respiratory distress. She has no wheezes. She has no rales. She exhibits no tenderness.  Abdominal: Soft. Normal appearance and bowel sounds are normal. She exhibits no distension, no abdominal bruit, no ascites and no mass. There is no hepatosplenomegaly. There is no tenderness. There is no rigidity, no rebound and no guarding. No hernia.  Genitourinary: Rectum normal, vagina normal and uterus normal. No breast swelling, tenderness, discharge or bleeding. Cervix exhibits no motion tenderness, no discharge and no friability. Right adnexum displays no mass, no tenderness and no fullness. Left adnexum displays no mass, no tenderness and no fullness. No erythema, tenderness or bleeding in the vagina. No vaginal discharge found.  Musculoskeletal: Normal range of motion. She exhibits no edema or tenderness.  Lymphadenopathy:    She has no cervical adenopathy.  Neurological: She is alert. She has normal reflexes. No cranial nerve deficit. She exhibits normal muscle tone. Coordination normal.  Skin: Skin is warm and dry. No rash noted. She is not diaphoretic. No erythema. No pallor.  Psychiatric: She has a normal mood and affect. Her behavior is normal. Judgment and thought content normal.          Assessment & Plan:  Well exam.

## 2014-09-07 NOTE — Addendum Note (Signed)
Addended by: Aggie Hacker A on: 09/07/2014 11:54 AM   Modules accepted: Orders

## 2014-09-08 LAB — CYTOLOGY - PAP

## 2015-09-18 ENCOUNTER — Other Ambulatory Visit: Payer: Self-pay | Admitting: Family Medicine

## 2015-10-21 ENCOUNTER — Other Ambulatory Visit: Payer: Self-pay | Admitting: Family Medicine

## 2015-10-24 ENCOUNTER — Other Ambulatory Visit: Payer: Self-pay | Admitting: Family Medicine

## 2015-10-24 MED ORDER — METFORMIN HCL 500 MG PO TABS: ORAL_TABLET | ORAL | Status: DC

## 2015-10-24 NOTE — Telephone Encounter (Signed)
Pharmacy called and pt is almost out of medication. I sent in a 1 month supply and advised that pt schedule a office visit.

## 2015-10-24 NOTE — Telephone Encounter (Signed)
Refill request for Metformin.

## 2015-11-24 ENCOUNTER — Other Ambulatory Visit: Payer: Self-pay | Admitting: Family Medicine

## 2015-12-16 ENCOUNTER — Other Ambulatory Visit (INDEPENDENT_AMBULATORY_CARE_PROVIDER_SITE_OTHER): Payer: 59

## 2015-12-16 DIAGNOSIS — Z0000 Encounter for general adult medical examination without abnormal findings: Secondary | ICD-10-CM | POA: Diagnosis not present

## 2015-12-16 LAB — BASIC METABOLIC PANEL
BUN: 16 mg/dL (ref 6–23)
CO2: 28 meq/L (ref 19–32)
Calcium: 9.3 mg/dL (ref 8.4–10.5)
Chloride: 103 mEq/L (ref 96–112)
Creatinine, Ser: 0.72 mg/dL (ref 0.40–1.20)
GFR: 111.15 mL/min (ref >60.00)
GLUCOSE: 145 mg/dL — AB (ref 70–99)
POTASSIUM: 3.3 meq/L — AB (ref 3.5–5.1)
SODIUM: 140 meq/L (ref 135–145)

## 2015-12-16 LAB — MICROALBUMIN / CREATININE URINE RATIO
Creatinine,U: 185.7 mg/dL
Microalb Creat Ratio: 3.5 mg/g (ref 0.0–30.0)
Microalb, Ur: 6.5 mg/dL — ABNORMAL HIGH (ref 0.0–1.9)

## 2015-12-16 LAB — CBC WITH DIFFERENTIAL/PLATELET
Basophils Absolute: 0 10*3/uL (ref 0.0–0.1)
Basophils Relative: 0.7 % (ref 0.0–3.0)
EOS PCT: 3.7 % (ref 0.0–5.0)
Eosinophils Absolute: 0.2 10*3/uL (ref 0.0–0.7)
HCT: 33.4 % — ABNORMAL LOW (ref 36.0–46.0)
Hemoglobin: 11.3 g/dL — ABNORMAL LOW (ref 12.0–15.0)
LYMPHS ABS: 1.9 10*3/uL (ref 0.7–4.0)
Lymphocytes Relative: 43 % (ref 12.0–46.0)
MCHC: 33.7 g/dL (ref 30.0–36.0)
MCV: 92.3 fl (ref 78.0–100.0)
MONO ABS: 0.5 10*3/uL (ref 0.1–1.0)
MONOS PCT: 11.7 % (ref 3.0–12.0)
NEUTROS ABS: 1.8 10*3/uL (ref 1.4–7.7)
NEUTROS PCT: 40.9 % — AB (ref 43.0–77.0)
PLATELETS: 230 10*3/uL (ref 150.0–400.0)
RBC: 3.62 Mil/uL — ABNORMAL LOW (ref 3.87–5.11)
RDW: 12.6 % (ref 11.5–15.5)
WBC: 4.3 10*3/uL (ref 4.0–10.5)

## 2015-12-16 LAB — LIPID PANEL
CHOLESTEROL: 189 mg/dL (ref 0–200)
HDL: 45.7 mg/dL (ref >39.00)
LDL CALC: 120 mg/dL — AB (ref 0–99)
NonHDL: 143.3
TRIGLYCERIDES: 118 mg/dL (ref 0.0–149.0)
Total CHOL/HDL Ratio: 4
VLDL: 23.6 mg/dL (ref 0.0–40.0)

## 2015-12-16 LAB — POC URINALSYSI DIPSTICK (AUTOMATED)
BILIRUBIN UA: NEGATIVE
GLUCOSE UA: NEGATIVE
Ketones, UA: NEGATIVE
Leukocytes, UA: NEGATIVE
Nitrite, UA: NEGATIVE
RBC UA: NEGATIVE
SPEC GRAV UA: 1.025
Urobilinogen, UA: 1
pH, UA: 5.5

## 2015-12-16 LAB — HEPATIC FUNCTION PANEL
ALBUMIN: 3.9 g/dL (ref 3.5–5.2)
ALT: 35 U/L (ref 0–35)
AST: 42 U/L — ABNORMAL HIGH (ref 0–37)
Alkaline Phosphatase: 60 U/L (ref 39–117)
BILIRUBIN TOTAL: 0.5 mg/dL (ref 0.2–1.2)
Bilirubin, Direct: 0.1 mg/dL (ref 0.0–0.3)
Total Protein: 7 g/dL (ref 6.0–8.3)

## 2015-12-16 LAB — HEMOGLOBIN A1C: Hgb A1c MFr Bld: 6.8 % — ABNORMAL HIGH (ref 4.6–6.5)

## 2015-12-16 LAB — TSH: TSH: 1.6 u[IU]/mL (ref 0.35–4.50)

## 2015-12-21 MED ORDER — POTASSIUM CHLORIDE ER 10 MEQ PO TBCR: 10.0000 meq | EXTENDED_RELEASE_TABLET | Freq: Every day | ORAL | Status: DC

## 2015-12-23 ENCOUNTER — Encounter: Payer: Self-pay | Admitting: Family Medicine

## 2015-12-23 ENCOUNTER — Other Ambulatory Visit: Payer: Self-pay | Admitting: Family Medicine

## 2015-12-28 ENCOUNTER — Other Ambulatory Visit (HOSPITAL_COMMUNITY)
Admission: RE | Admit: 2015-12-28 | Discharge: 2015-12-28 | Disposition: A | Discharge status: Home / Self Care | Payer: 59 | Source: Ambulatory Visit | Attending: Family Medicine | Admitting: Family Medicine

## 2015-12-28 ENCOUNTER — Ambulatory Visit (INDEPENDENT_AMBULATORY_CARE_PROVIDER_SITE_OTHER): Payer: 59 | Admitting: Family Medicine

## 2015-12-28 ENCOUNTER — Encounter: Payer: Self-pay | Admitting: Family Medicine

## 2015-12-28 VITALS — BP 140/90 | HR 89 | Temp 99.0°F | Ht 66.75 in | Wt 236.0 lb

## 2015-12-28 DIAGNOSIS — Z1151 Encounter for screening for human papillomavirus (HPV): Secondary | ICD-10-CM | POA: Insufficient documentation

## 2015-12-28 DIAGNOSIS — R87610 Atypical squamous cells of undetermined significance on cytologic smear of cervix (ASC-US): Secondary | ICD-10-CM | POA: Diagnosis not present

## 2015-12-28 DIAGNOSIS — Z0000 Encounter for general adult medical examination without abnormal findings: Secondary | ICD-10-CM

## 2015-12-28 DIAGNOSIS — E876 Hypokalemia: Secondary | ICD-10-CM

## 2015-12-28 DIAGNOSIS — R87619 Unspecified abnormal cytological findings in specimens from cervix uteri: Secondary | ICD-10-CM

## 2015-12-28 DIAGNOSIS — Z01411 Encounter for gynecological examination (general) (routine) with abnormal findings: Secondary | ICD-10-CM | POA: Insufficient documentation

## 2015-12-28 HISTORY — DX: Unspecified abnormal cytological findings in specimens from cervix uteri: R87.619

## 2015-12-28 MED ORDER — POTASSIUM CHLORIDE ER 10 MEQ PO TBCR: 10.0000 meq | EXTENDED_RELEASE_TABLET | Freq: Every day | ORAL | Status: DC

## 2015-12-28 MED ORDER — LISINOPRIL-HYDROCHLOROTHIAZIDE 20-12.5 MG PO TABS: 1.0000 | ORAL_TABLET | Freq: Two times a day (BID) | ORAL | Status: DC

## 2015-12-28 MED ORDER — METFORMIN HCL 500 MG PO TABS: 500.0000 mg | ORAL_TABLET | Freq: Two times a day (BID) | ORAL | Status: DC

## 2015-12-28 MED ORDER — AMLODIPINE BESYLATE 10 MG PO TABS: 10.0000 mg | ORAL_TABLET | Freq: Every day | ORAL | Status: DC

## 2015-12-28 NOTE — Progress Notes (Signed)
   Subjective:    Patient ID: Jasmine Matthews, female    DOB: Jan 22, 1966, 50 y.o.   MRN: [id number]  HPI 50 yr old female for a cpx. She feels well.    Review of Systems  Constitutional: Negative.  Negative for fever, diaphoresis, activity change, appetite change, fatigue and unexpected weight change.  HENT: Negative.  Negative for congestion, ear pain, hearing loss, nosebleeds, sore throat, tinnitus, trouble swallowing and voice change.   Eyes: Negative.  Negative for photophobia, pain, discharge, redness and visual disturbance.  Respiratory: Negative.  Negative for apnea, cough, choking, chest tightness, shortness of breath, wheezing and stridor.   Cardiovascular: Negative.  Negative for chest pain, palpitations and leg swelling.  Gastrointestinal: Negative.  Negative for nausea, vomiting, abdominal pain, diarrhea, constipation, blood in stool, abdominal distention and rectal pain.  Genitourinary: Negative.  Negative for dysuria, urgency, frequency, hematuria, flank pain, vaginal bleeding, vaginal discharge, enuresis, difficulty urinating, vaginal pain and menstrual problem.  Musculoskeletal: Negative.  Negative for myalgias, back pain, joint swelling, arthralgias, gait problem, neck pain and neck stiffness.  Skin: Negative.  Negative for color change, pallor, rash and wound.  Neurological: Negative.  Negative for dizziness, tremors, seizures, syncope, speech difficulty, weakness, light-headedness, numbness and headaches.  Hematological: Negative for adenopathy. Does not bruise/bleed easily.  Psychiatric/Behavioral: Negative.  Negative for hallucinations, behavioral problems, confusion, sleep disturbance, dysphoric mood and agitation. The patient is not nervous/anxious.        Objective:   Physical Exam  Constitutional: She appears well-developed and well-nourished. No distress.  HENT:  Head: Normocephalic and atraumatic.  Right Ear: External ear normal.  Left Ear: External ear normal.    Nose: Nose normal.  Mouth/Throat: Oropharynx is clear and moist. No oropharyngeal exudate.  Eyes: Conjunctivae and EOM are normal. Pupils are equal, round, and reactive to light. Right eye exhibits no discharge. Left eye exhibits no discharge. No scleral icterus.  Neck: Normal range of motion. Neck supple. No JVD present. No thyromegaly present.  Cardiovascular: Normal rate, regular rhythm, normal heart sounds and intact distal pulses.  Exam reveals no gallop and no friction rub.   No murmur heard. Pulmonary/Chest: Effort normal and breath sounds normal. No stridor. No respiratory distress. She has no wheezes. She has no rales. She exhibits no tenderness.  Abdominal: Soft. Normal appearance and bowel sounds are normal. She exhibits no distension, no abdominal bruit, no ascites and no mass. There is no hepatosplenomegaly. There is no tenderness. There is no rigidity, no rebound and no guarding. No hernia.  Genitourinary: Rectum normal, vagina normal and uterus normal. No breast swelling, tenderness, discharge or bleeding. Cervix exhibits no motion tenderness, no discharge and no friability. Right adnexum displays no mass, no tenderness and no fullness. Left adnexum displays no mass, no tenderness and no fullness. No erythema, tenderness or bleeding in the vagina. No vaginal discharge found.  Musculoskeletal: Normal range of motion. She exhibits no edema or tenderness.  Lymphadenopathy:    She has no cervical adenopathy.  Neurological: She is alert. She has normal reflexes. No cranial nerve deficit. She exhibits normal muscle tone. Coordination normal.  Skin: Skin is warm and dry. No rash noted. She is not diaphoretic. No erythema. No pallor.  Psychiatric: She has a normal mood and affect. Her behavior is normal. Judgment and thought content normal.          Assessment & Plan:  Well exam. We discussed diet and exercise.

## 2015-12-28 NOTE — Addendum Note (Signed)
Addended by: Aggie Hacker A on: 12/28/2015 11:17 AM   Modules accepted: Orders

## 2015-12-28 NOTE — Progress Notes (Signed)
Pre visit review using our clinic review tool, if applicable. No additional management support is needed unless otherwise documented below in the visit note. 

## 2015-12-28 NOTE — Addendum Note (Signed)
Addended by: Alysia Penna A on: 12/28/2015 10:55 AM   Modules accepted: Orders

## 2015-12-30 LAB — CYTOLOGY - PAP

## 2016-01-03 NOTE — Addendum Note (Signed)
Addended by: Alysia Penna A on: 01/03/2016 10:16 PM   Modules accepted: Orders

## 2016-01-06 ENCOUNTER — Telehealth: Payer: Self-pay | Admitting: Obstetrics & Gynecology

## 2016-01-06 NOTE — Telephone Encounter (Signed)
Called patient to review benefits for a recommended procedure. Left Voicemail requesting a call back. °

## 2016-01-06 NOTE — Telephone Encounter (Signed)
Spoke to patient regarding referral from Dr Barbie Banner office. Patient had questions regarding referral. Forwarded call to our triage to address concerns.

## 2016-01-06 NOTE — Telephone Encounter (Signed)
Spoke with patient. Advised patient when she was seen with her PCP Dr.Fry on 12/28/2015 a pap smear was performed. Advised pap smear returned showing ASCUS cells with positive HPV. Explained pap results and recommendation to have colposcopy for further evaluation. Patient would like to discuss her results in further detail with Dr.Miller prior to scheduling. "I am just confused because I did not even know of the results." Consultation appointment scheduled with Dr.Miller for 01/10/2016 at 9:15 am. She is agreeable to date and time. Provided address and telephone number to our office.  Routing to provider for final review. Patient agreeable to disposition. Will close encounter.

## 2016-01-10 ENCOUNTER — Encounter: Payer: Self-pay | Admitting: Obstetrics & Gynecology

## 2016-01-10 ENCOUNTER — Ambulatory Visit (INDEPENDENT_AMBULATORY_CARE_PROVIDER_SITE_OTHER): Payer: 59 | Admitting: Obstetrics & Gynecology

## 2016-01-10 VITALS — BP 110/70 | HR 86 | Resp 16 | Ht 66.0 in | Wt 241.0 lb

## 2016-01-10 DIAGNOSIS — IMO0002 Reserved for concepts with insufficient information to code with codable children: Secondary | ICD-10-CM

## 2016-01-10 DIAGNOSIS — R896 Abnormal cytological findings in specimens from other organs, systems and tissues: Secondary | ICD-10-CM | POA: Diagnosis not present

## 2016-01-10 NOTE — Progress Notes (Signed)
Patient scheduled for colposcopy with Dr. Sabra Heck 01/19/16 at 10:00. Uses condoms for contraception. Last menses approximately end of February, patient states her cycles are typically regular for her, lasts 3 days.  She is advised to call back if she does not start her cycle as expected or is bleeding at time of appointment.   Instructions given. Motrin 800 mg po one hour before appointment with food. Make sure to eat a meal before appointment and drink plenty of fluids. Patient verbalized understanding and will call to reschedule if will be on menses or has any concerns regarding pregnancy.

## 2016-01-10 NOTE — Progress Notes (Deleted)
50 y.o. No obstetric history on file. Single{Race/ethnicity:17218}F here for annual exam.    No LMP recorded.          Sexually active: {yes no:314532}  The current method of family planning is {contraception:315051}.    Exercising: {yes no:314532}  {types:19826} Smoker:  {YES V2345720  Health Maintenance: Pap:  12/28/15 ASCUS. HR [id number] History of abnormal Pap:  {YES NO:22349} MMG:  01/21/14 BIRADS1:Neg Colonoscopy:  *** BMD:   *** TDaP:  *** Screening Labs: ***, Hb today: ***, Urine today: ***   reports that she has never smoked. She has never used smokeless tobacco. She reports that she drinks about 1.2 oz of alcohol per week. She reports that she does not use illicit drugs.  Past Medical History  Diagnosis Date  . Hypertension   . Diabetes mellitus     Past Surgical History  Procedure Laterality Date  . Foot surgery  10-17-09    per Dr. Berenice Primas, removed a fractured sesamoid bone     Current Outpatient Prescriptions  Medication Sig Dispense Refill  . amLODipine (NORVASC) 10 MG tablet Take 1 tablet (10 mg total) by mouth daily. 90 tablet 3  . lisinopril-hydrochlorothiazide (PRINZIDE,ZESTORETIC) 20-12.5 MG tablet Take 1 tablet by mouth 2 (two) times daily. 180 tablet 3  . metFORMIN (GLUCOPHAGE) 500 MG tablet Take 1 tablet (500 mg total) by mouth 2 (two) times daily with a meal. 180 tablet 3  . potassium chloride (KLOR-CON 10) 10 MEQ tablet Take 1 tablet (10 mEq total) by mouth daily. 90 tablet 3   No current facility-administered medications for this visit.    Family History  Problem Relation Age of Onset  . Hypertension Father     ROS:  Pertinent items are noted in HPI.  Otherwise, a comprehensive ROS was negative.  Exam:   There were no vitals taken for this visit.  Weight change: @WEIGHTCHANGE @ Height:      Ht Readings from Last 3 Encounters:  12/28/15 5' 6.75" (1.695 m)  09/07/14 5' 6.75" (1.695 m)  07/23/13 5' 6.75" (1.695 m)    General appearance:  alert, cooperative and appears stated age Head: Normocephalic, without obvious abnormality, atraumatic Neck: no adenopathy, supple, symmetrical, trachea midline and thyroid {EXAM; THYROID:18604} Lungs: clear to auscultation bilaterally Breasts: {Exam; breast:13139::"normal appearance, no masses or tenderness"} Heart: regular rate and rhythm Abdomen: soft, non-tender; bowel sounds normal; no masses,  no organomegaly Extremities: extremities normal, atraumatic, no cyanosis or edema Skin: Skin color, texture, turgor normal. No rashes or lesions Lymph nodes: Cervical, supraclavicular, and axillary nodes normal. No abnormal inguinal nodes palpated Neurologic: Grossly normal   Pelvic: External genitalia:  no lesions              Urethra:  normal appearing urethra with no masses, tenderness or lesions              Bartholins and Skenes: normal                 Vagina: normal appearing vagina with normal color and discharge, no lesions              Cervix: {exam; cervix:14595}              Pap taken: {yes no:314532} Bimanual Exam:  Uterus:  {exam; uterus:12215}              Adnexa: {exam; adnexa:12223}               Rectovaginal: Confirms  Anus:  normal sphincter tone, no lesions  Chaperone was present for exam.  A:  Well Woman with normal exam  P:   {plan; gyn:5269::"mammogram","pap smear","return annually or prn"}

## 2016-01-10 NOTE — Progress Notes (Signed)
50 y.o. G0P0000 SingleAfrican AmericanF here for discussion of abnormal Pap smear obtained 12/28/15.  This was ASCUS with +HR HPV.  Review of records shows the pt had the same Pap smear abnormality 11/15.  Recommendation for gyn referral was made.  Pt did not have any evaluation done at that time.  Prior to that, all of her pap smears have been normal per the pt.  She is anxious about her Pap smear results and doesn't have a clear understanding of her results.  She and I discussed recent Pap smear and HPV findings.  D/W pt nature of disease of HR HPV and relation to cervical dysplasia and cervical cancer.  Typical evaluation with colposcopy, possible biopsies, ECC reviewed.  Different types of pap smear abnormalities reviewed as well.  Risks factors and risk reduction reviewed.  All questions answered.  Pt does not want to proceed with colposcopy today.  States she just got off work and feels she needs to go home and take a shower.  Will plan to return for additional evaluation.  Patient's last menstrual period was 12/13/2015 (approximate).          Sexually active: Yes.    The current method of family planning is condoms Every time.    Exercising: Yes.    Walking Smoker:  no  Health Maintenance: Pap: 12/28/15 ASCUS. HR [id number]  History of abnormal Pap: yes.  MMG:  01/21/2014 BIRADS1:Neg Colonoscopy:  Never BMD:   Never TDaP:  Unsure Screening Labs: PCP, Urine today: PCP   reports that she has never smoked. She has never used smokeless tobacco. She reports that she drinks about 1.8 oz of alcohol per week. She reports that she does not use illicit drugs.  Past Medical History  Diagnosis Date  . Hypertension   . Diabetes mellitus   . Abnormal Pap smear of cervix 12/28/15     ASCUS. HR [id number]    Past Surgical History  Procedure Laterality Date  . Foot surgery  10-17-09    per Dr. Berenice Primas, removed a fractured sesamoid bone     Current Outpatient Prescriptions  Medication Sig  Dispense Refill  . amLODipine (NORVASC) 10 MG tablet Take 1 tablet (10 mg total) by mouth daily. 90 tablet 3  . lisinopril-hydrochlorothiazide (PRINZIDE,ZESTORETIC) 20-12.5 MG tablet Take 1 tablet by mouth 2 (two) times daily. 180 tablet 3  . metFORMIN (GLUCOPHAGE) 500 MG tablet Take 1 tablet (500 mg total) by mouth 2 (two) times daily with a meal. 180 tablet 3  . potassium chloride (KLOR-CON 10) 10 MEQ tablet Take 1 tablet (10 mEq total) by mouth daily. 90 tablet 3   No current facility-administered medications for this visit.    Family History  Problem Relation Age of Onset  . Hypertension Father   . Hypertension Mother     ROS:  Pertinent items are noted in HPI.  Otherwise, a comprehensive ROS was negative.  Exam:   BP 110/70 mmHg  Pulse 86  Resp 16  Ht 5\' 6"  (1.676 m)  Wt 241 lb (109.317 kg)  BMI 38.92 kg/m2  LMP 12/13/2015 (Approximate)   Height: 5\' 6"  (167.6 cm)  Ht Readings from Last 3 Encounters:  01/10/16 5\' 6"  (1.676 m)  12/28/15 5' 6.75" (1.695 m)  09/07/14 5' 6.75" (1.695 m)   Physical Exam  Constitutional: She is oriented to person, place, and time. She appears well-developed and well-nourished.  Neurological: She is alert and oriented to person, place, and time.  Skin: Skin is warm and  dry.  Psychiatric: She has a normal mood and affect. Thought content normal.   Pelvic: not performed today   A:  H/O ASCUS with +HR HPV pap smear Hypertension Diabetes  P:   Pt will return for colposcopy over the next few weeks.  All questions answered today.  Pt advised to take 800mg  Ibuprofen about an hour before her appt for the colposcopy.

## 2016-01-11 ENCOUNTER — Telehealth: Payer: Self-pay | Admitting: Obstetrics & Gynecology

## 2016-01-11 NOTE — Telephone Encounter (Signed)
Called patient to review benefits for procedure. Left voicemail to call back and review. °

## 2016-01-13 ENCOUNTER — Telehealth: Payer: Self-pay | Admitting: Emergency Medicine

## 2016-01-13 NOTE — Telephone Encounter (Signed)
Patient called to request her colposcopy appointment be changed due to work schedule. LMP 01/11/16. Cycle for 3 days only per patient.  Condoms only for birth control. Uses condoms regularly.   Patient had consult with Dr. Sabra Heck regarding results and procedure.   Appointment rescheduled from 01/22/16 to 02/03/16 at 10:00.  Patient agreeable.   Routing update to Dr. Sabra Heck.  Okay as scheduled?

## 2016-01-13 NOTE — Telephone Encounter (Signed)
Yes.  It is fine as you've scheduled.  Ok to close encounter.

## 2016-01-19 ENCOUNTER — Ambulatory Visit: Payer: 59 | Admitting: Obstetrics & Gynecology

## 2016-02-03 ENCOUNTER — Ambulatory Visit (INDEPENDENT_AMBULATORY_CARE_PROVIDER_SITE_OTHER): Payer: 59 | Admitting: Obstetrics & Gynecology

## 2016-02-03 ENCOUNTER — Encounter: Payer: Self-pay | Admitting: Obstetrics & Gynecology

## 2016-02-03 DIAGNOSIS — R896 Abnormal cytological findings in specimens from other organs, systems and tissues: Secondary | ICD-10-CM | POA: Diagnosis not present

## 2016-02-03 DIAGNOSIS — IMO0002 Reserved for concepts with insufficient information to code with codable children: Secondary | ICD-10-CM

## 2016-02-03 NOTE — Progress Notes (Signed)
50 y.o. Single AA female here for colposcopy with possible biopsies and/or ECC due to ASCUS pap with +HR HPV Pap obtained with Dr Sarajane Jews 12/28/15.    Patient's last menstrual period was 01/10/2016.          Sexually active: Yes.    The current method of family planning is condoms all of the time.     Patient has been counseled about results and procedure.  Risks and benefits have bene reviewed including immediate and/or delayed bleeding, infection, cervical scaring from procedure, possibility of needing additional follow up as well as treatment.  rare risks of missing a lesion discussed as well.  All questions answered.  Pt ready to proceed.  BP 112/80 mmHg  Pulse 88  Resp 16  Ht 5\' 6"  (1.676 m)  Wt 239 lb (108.41 kg)  BMI 38.59 kg/m2  LMP 01/10/2016  Physical Exam  Constitutional: She appears well-developed and well-nourished.  Genitourinary: Vagina normal. There is no rash, tenderness, lesion or injury on the right labia. There is no rash, tenderness, lesion or injury on the left labia. Cervix exhibits no motion tenderness, no discharge and no friability.    Lymphadenopathy:       Right: No inguinal adenopathy present.       Left: No inguinal adenopathy present.  Skin: Skin is warm and dry.  Psychiatric: She has a normal mood and affect.    Speculum placed.  3% acetic acid applied to cervix for >45 seconds.  Cervix visualized with both 7.5X and 15X magnification.  Green filter also used.  Lugols solution was used.  Findings:  No AWE and no decreased staining with Lugols solution.  Biopsy:  Not obtained.  ECC:  was performed.  Monsel's was not needed.  Minimal bleeding noted.  Pt tolerated procedure well and all instruments were removed.  Findings noted above on picture of cervix.  Assessment:  ASCUS pap with +HR HPV  Plan:  ECC pending.  IF negative, pt will need pap and HR HPV in one year.  Pt will need to continue to see Dr. Sarajane Jews so if pap and HR HPV only needed in one year, ok for Dr.  Sarajane Jews to do this.    Pathology results will be called to patient and follow-up planned pending results.

## 2016-02-03 NOTE — Patient Instructions (Signed)

## 2016-02-06 NOTE — Addendum Note (Signed)
Addended by: Elroy Channel on: 02/06/2016 09:26 AM   Modules accepted: Orders

## 2016-02-06 NOTE — Addendum Note (Signed)
Addended by: Elroy Channel on: 02/06/2016 08:56 AM   Modules accepted: Orders

## 2016-02-08 ENCOUNTER — Telehealth: Payer: Self-pay | Admitting: *Deleted

## 2016-02-08 NOTE — Telephone Encounter (Signed)
-----   Message from Nunzio Cobbs, MD sent at 02/07/2016  4:55 PM EDT ----- This is Dr. Quincy Simmonds reviewing Dr. Elisha Ponder in her absence.  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  The endocervical cells were benign. There is no sign of any cancer. Dr. Sabra Heck will review upon her return and determine the final plan.  [place].

## 2016-02-08 NOTE — Telephone Encounter (Signed)
Call to patient, left message to call back. 

## 2016-02-10 NOTE — Telephone Encounter (Signed)
-----   Message from Salvadore Dom, MD sent at 02/07/2016  4:53 PM EDT ----- Please inform ECC with CIN 1, recommend f/u pap and hpv in 1 year. Will let Dr Sabra Heck know.

## 2016-02-10 NOTE — Telephone Encounter (Signed)
Call to patient. Advised of pap report as reviewed by Dr Quincy Simmonds and Dr Talbert Nan while Dr Sabra Heck out of office. See Result note. Advised anticipate recommendation for repeat pap and cotest in one year; however, Dr Sabra Heck will review and if any changes we will call her back. Patient thinks she would like to have her annual here with Dr Sabra Heck instead of with PCP. She works rotating shifts so is unable to schedule at this time. Stressed importance of follow- up pap in one year, here or with PCP,  to be sure pap is improving and not getting worse.  Voiced understanding and states she will call back a few months ahead when she knows her shift rotation. 08 recall entered.    Routing to provider for final review. Patient agreeable to disposition. Will close encounter.

## 2016-02-10 NOTE — Telephone Encounter (Signed)
Notified as directed.

## 2016-02-22 ENCOUNTER — Ambulatory Visit (INDEPENDENT_AMBULATORY_CARE_PROVIDER_SITE_OTHER): Payer: 59 | Admitting: Family Medicine

## 2016-02-22 ENCOUNTER — Encounter: Payer: Self-pay | Admitting: Family Medicine

## 2016-02-22 VITALS — BP 166/94 | HR 84 | Temp 98.7°F | Ht 66.0 in | Wt 243.0 lb

## 2016-02-22 DIAGNOSIS — A09 Infectious gastroenteritis and colitis, unspecified: Secondary | ICD-10-CM | POA: Diagnosis not present

## 2016-02-22 MED ORDER — ONDANSETRON HCL 8 MG PO TABS: 8.0000 mg | ORAL_TABLET | Freq: Three times a day (TID) | ORAL | Status: DC | PRN

## 2016-02-22 NOTE — Progress Notes (Signed)
   Subjective:    Patient ID: Jasmine Matthews, female    DOB: 07/11/1966, 50 y.o.   MRN: [id number]  HPI Here for 4 days of nausea without vomiting, mild lower abdominal cramps, and diarrhea. No fever. No recent travels. Appetite is poor but she is drinking plenty of fluids.    Review of Systems  Constitutional: Positive for chills. Negative for fever and diaphoresis.  HENT: Negative.   Eyes: Negative.   Respiratory: Negative.   Gastrointestinal: Positive for nausea, abdominal pain and diarrhea. Negative for vomiting, constipation, blood in stool, abdominal distention and anal bleeding.  Genitourinary: Negative.        Objective:   Physical Exam  Constitutional: She appears well-developed and well-nourished. No distress.  Neck: No thyromegaly present.  Cardiovascular: Normal rate, regular rhythm, normal heart sounds and intact distal pulses.   Pulmonary/Chest: Effort normal and breath sounds normal.  Abdominal: Soft. Bowel sounds are normal. She exhibits no distension and no mass. There is no rebound and no guarding.  Slightly tender in both lower quadrants   Lymphadenopathy:    She has no cervical adenopathy.          Assessment & Plan:  Viral enteritis, try Imodium AD for diarrhea and Zofran for nausea. Advance the diet as tolerated. Written out of work 02-20-16 through today.  Laurey Morale, MD

## 2016-02-22 NOTE — Progress Notes (Signed)
Pre visit review using our clinic review tool, if applicable. No additional management support is needed unless otherwise documented below in the visit note. 

## 2016-02-23 ENCOUNTER — Telehealth: Payer: Self-pay | Admitting: Family Medicine

## 2016-02-23 NOTE — Telephone Encounter (Signed)
Pt saw dr fry 02-22-16 and needs a work note to return back to work on 02-27-16 with no restriction

## 2016-02-23 NOTE — Telephone Encounter (Signed)
I spoke with pt  

## 2016-02-23 NOTE — Telephone Encounter (Signed)
The note was written  

## 2016-06-21 ENCOUNTER — Telehealth: Payer: Self-pay | Admitting: Family Medicine

## 2016-06-21 NOTE — Telephone Encounter (Signed)
Pt has yeast infection and would like diflucan call into [facility] [address]. Pt is aware md out of office this afternoon

## 2016-06-22 MED ORDER — FLUCONAZOLE 150 MG PO TABS: 150.0000 mg | ORAL_TABLET | Freq: Every day | ORAL | 5 refills | Status: DC

## 2016-06-22 NOTE — Telephone Encounter (Signed)
Call in Diflucan 150 mg tabs to take one., with 5 rf

## 2016-06-22 NOTE — Telephone Encounter (Signed)
I sent script e-scribe to below pharmacy, left pt a voice message with this information.

## 2016-07-19 ENCOUNTER — Encounter: Payer: Self-pay | Admitting: Obstetrics & Gynecology

## 2016-08-14 ENCOUNTER — Encounter: Payer: Self-pay | Admitting: Family Medicine

## 2016-08-14 ENCOUNTER — Ambulatory Visit (INDEPENDENT_AMBULATORY_CARE_PROVIDER_SITE_OTHER): Payer: Self-pay | Admitting: Family Medicine

## 2016-08-14 VITALS — BP 180/86 | HR 86 | Temp 98.8°F | Ht 66.0 in | Wt 248.0 lb

## 2016-08-14 DIAGNOSIS — N3000 Acute cystitis without hematuria: Secondary | ICD-10-CM

## 2016-08-14 LAB — POC URINALSYSI DIPSTICK (AUTOMATED)
Bilirubin, UA: NEGATIVE
Blood, UA: NEGATIVE
Glucose, UA: NEGATIVE
Nitrite, UA: NEGATIVE
SPEC GRAV UA: 1.025
UROBILINOGEN UA: 0.2
pH, UA: 6.5

## 2016-08-14 MED ORDER — CIPROFLOXACIN HCL 500 MG PO TABS: 500.0000 mg | ORAL_TABLET | Freq: Two times a day (BID) | ORAL | 0 refills | Status: DC

## 2016-08-14 NOTE — Progress Notes (Signed)
   Subjective:    Patient ID: Jasmine Matthews, female    DOB: 05-05-1966, 50 y.o.   MRN: [id number]  HPI Here for 3 days of urinary burning and frequency. No fever. She drinks plenty of water.    Review of Systems  Constitutional: Negative.   Respiratory: Negative.   Cardiovascular: Negative.   Gastrointestinal: Negative.   Genitourinary: Positive for dysuria, frequency and urgency. Negative for flank pain, hematuria and pelvic pain.       Objective:   Physical Exam  Constitutional: She appears well-developed and well-nourished.  Cardiovascular: Normal rate, regular rhythm, normal heart sounds and intact distal pulses.   Pulmonary/Chest: Effort normal and breath sounds normal.  Abdominal: Soft. Bowel sounds are normal. She exhibits no distension and no mass. There is no tenderness. There is no rebound and no guarding.          Assessment & Plan:  UTI, treat with Cipro. Culture the sample.  Laurey Morale, MD

## 2016-08-14 NOTE — Progress Notes (Signed)
Pre visit review using our clinic review tool, if applicable. No additional management support is needed unless otherwise documented below in the visit note. 

## 2016-08-14 NOTE — Addendum Note (Signed)
Addended by: Aggie Hacker A on: 08/14/2016 11:05 AM   Modules accepted: Orders

## 2016-08-15 LAB — URINE CULTURE

## 2016-12-31 ENCOUNTER — Encounter: Payer: Self-pay | Admitting: *Deleted

## 2016-12-31 NOTE — Telephone Encounter (Signed)
error 

## 2017-01-01 ENCOUNTER — Other Ambulatory Visit: Payer: Self-pay

## 2017-01-07 ENCOUNTER — Other Ambulatory Visit (HOSPITAL_COMMUNITY)
Admission: RE | Admit: 2017-01-07 | Discharge: 2017-01-07 | Disposition: A | Discharge status: Home / Self Care | Payer: Managed Care, Other (non HMO) | Source: Ambulatory Visit | Attending: Family Medicine | Admitting: Family Medicine

## 2017-01-07 ENCOUNTER — Encounter: Payer: Self-pay | Admitting: Family Medicine

## 2017-01-07 ENCOUNTER — Telehealth: Payer: Self-pay | Admitting: *Deleted

## 2017-01-07 ENCOUNTER — Ambulatory Visit (INDEPENDENT_AMBULATORY_CARE_PROVIDER_SITE_OTHER): Payer: Managed Care, Other (non HMO) | Admitting: Family Medicine

## 2017-01-07 VITALS — BP 134/98 | HR 81 | Temp 98.3°F | Ht 66.0 in | Wt 228.0 lb

## 2017-01-07 DIAGNOSIS — R300 Dysuria: Secondary | ICD-10-CM | POA: Diagnosis not present

## 2017-01-07 DIAGNOSIS — Z01419 Encounter for gynecological examination (general) (routine) without abnormal findings: Secondary | ICD-10-CM | POA: Diagnosis not present

## 2017-01-07 DIAGNOSIS — Z1151 Encounter for screening for human papillomavirus (HPV): Secondary | ICD-10-CM | POA: Diagnosis present

## 2017-01-07 DIAGNOSIS — Z0000 Encounter for general adult medical examination without abnormal findings: Secondary | ICD-10-CM | POA: Diagnosis not present

## 2017-01-07 LAB — POC URINALSYSI DIPSTICK (AUTOMATED)
BILIRUBIN UA: NEGATIVE
GLUCOSE UA: NEGATIVE
Ketones, UA: NEGATIVE
NITRITE UA: NEGATIVE
Protein, UA: NEGATIVE
RBC UA: NEGATIVE
Spec Grav, UA: 1.03 (ref 1.030–1.035)
UROBILINOGEN UA: 0.2 (ref ?–2.0)
pH, UA: 6 (ref 5.0–8.0)

## 2017-01-07 LAB — LIPID PANEL
CHOLESTEROL: 221 mg/dL — AB (ref 0–200)
HDL: 66.4 mg/dL (ref >39.00)
LDL Cholesterol: 138 mg/dL — ABNORMAL HIGH (ref 0–99)
NonHDL: 154.4
Total CHOL/HDL Ratio: 3
Triglycerides: 81 mg/dL (ref 0.0–149.0)
VLDL: 16.2 mg/dL (ref 0.0–40.0)

## 2017-01-07 LAB — CBC WITH DIFFERENTIAL/PLATELET
Basophils Absolute: 0 10*3/uL (ref 0.0–0.1)
Basophils Relative: 0.8 % (ref 0.0–3.0)
EOS PCT: 5.1 % — AB (ref 0.0–5.0)
Eosinophils Absolute: 0.3 10*3/uL (ref 0.0–0.7)
HEMATOCRIT: 34.8 % — AB (ref 36.0–46.0)
HEMOGLOBIN: 11.4 g/dL — AB (ref 12.0–15.0)
LYMPHS PCT: 30.7 % (ref 12.0–46.0)
Lymphs Abs: 1.5 10*3/uL (ref 0.7–4.0)
MCHC: 32.9 g/dL (ref 30.0–36.0)
MCV: 96.6 fl (ref 78.0–100.0)
Monocytes Absolute: 0.6 10*3/uL (ref 0.1–1.0)
Monocytes Relative: 12.6 % — ABNORMAL HIGH (ref 3.0–12.0)
Neutro Abs: 2.5 10*3/uL (ref 1.4–7.7)
Neutrophils Relative %: 50.8 % (ref 43.0–77.0)
Platelets: 249 10*3/uL (ref 150.0–400.0)
RBC: 3.6 Mil/uL — AB (ref 3.87–5.11)
RDW: 12.5 % (ref 11.5–15.5)
WBC: 4.9 10*3/uL (ref 4.0–10.5)

## 2017-01-07 LAB — BASIC METABOLIC PANEL
BUN: 16 mg/dL (ref 6–23)
CHLORIDE: 104 meq/L (ref 96–112)
CO2: 28 mEq/L (ref 19–32)
Calcium: 9.8 mg/dL (ref 8.4–10.5)
Creatinine, Ser: 0.7 mg/dL (ref 0.40–1.20)
GFR: 114.31 mL/min (ref >60.00)
Glucose, Bld: 120 mg/dL — ABNORMAL HIGH (ref 70–99)
POTASSIUM: 3.6 meq/L (ref 3.5–5.1)
SODIUM: 139 meq/L (ref 135–145)

## 2017-01-07 LAB — MICROALBUMIN / CREATININE URINE RATIO
Creatinine,U: 219.2 mg/dL
MICROALB UR: 1.9 mg/dL (ref 0.0–1.9)
MICROALB/CREAT RATIO: 0.9 mg/g (ref 0.0–30.0)

## 2017-01-07 LAB — HEPATIC FUNCTION PANEL
ALBUMIN: 4 g/dL (ref 3.5–5.2)
ALK PHOS: 54 U/L (ref 39–117)
ALT: 11 U/L (ref 0–35)
AST: 11 U/L (ref 0–37)
Bilirubin, Direct: 0.1 mg/dL (ref 0.0–0.3)
TOTAL PROTEIN: 7 g/dL (ref 6.0–8.3)
Total Bilirubin: 0.6 mg/dL (ref 0.2–1.2)

## 2017-01-07 LAB — TSH: TSH: 2.15 u[IU]/mL (ref 0.35–4.50)

## 2017-01-07 LAB — HEMOGLOBIN A1C: HEMOGLOBIN A1C: 6.2 % (ref 4.6–6.5)

## 2017-01-07 MED ORDER — POTASSIUM CHLORIDE ER 10 MEQ PO TBCR: 10.0000 meq | EXTENDED_RELEASE_TABLET | Freq: Every day | ORAL | 3 refills | Status: DC

## 2017-01-07 MED ORDER — AMLODIPINE BESYLATE 10 MG PO TABS: 10.0000 mg | ORAL_TABLET | Freq: Every day | ORAL | 3 refills | Status: DC

## 2017-01-07 MED ORDER — LISINOPRIL-HYDROCHLOROTHIAZIDE 20-12.5 MG PO TABS: 1.0000 | ORAL_TABLET | Freq: Two times a day (BID) | ORAL | 3 refills | Status: DC

## 2017-01-07 MED ORDER — METFORMIN HCL 500 MG PO TABS: 500.0000 mg | ORAL_TABLET | Freq: Two times a day (BID) | ORAL | 3 refills | Status: DC

## 2017-01-07 NOTE — Addendum Note (Signed)
Addended by: Elmer Picker on: 01/07/2017 10:46 AM   Modules accepted: Orders

## 2017-01-07 NOTE — Progress Notes (Signed)
Subjective:    Patient ID: Jasmine Matthews, female    DOB: 1966-09-28, 51 y.o.   MRN: [id number]  HPI 51 yr old female for a well exam. She feels fine. Her still has regular monthly cycles. Her BP has been stable at home in the 120s over 80s. She has not checked her glucoses for some time.    Review of Systems  Constitutional: Negative.  Negative for activity change, appetite change, diaphoresis, fatigue, fever and unexpected weight change.  HENT: Negative.  Negative for congestion, ear pain, hearing loss, nosebleeds, sore throat, tinnitus, trouble swallowing and voice change.   Eyes: Negative.  Negative for photophobia, pain, discharge, redness and visual disturbance.  Respiratory: Negative.  Negative for apnea, cough, choking, chest tightness, shortness of breath, wheezing and stridor.   Cardiovascular: Negative.  Negative for chest pain, palpitations and leg swelling.  Gastrointestinal: Negative.  Negative for abdominal distention, abdominal pain, blood in stool, constipation, diarrhea, nausea, rectal pain and vomiting.  Genitourinary: Negative.  Negative for difficulty urinating, dysuria, enuresis, flank pain, frequency, hematuria, menstrual problem, urgency, vaginal bleeding, vaginal discharge and vaginal pain.  Musculoskeletal: Negative.  Negative for arthralgias, back pain, gait problem, joint swelling, myalgias, neck pain and neck stiffness.  Skin: Negative.  Negative for color change, pallor, rash and wound.  Neurological: Negative.  Negative for dizziness, tremors, seizures, syncope, speech difficulty, weakness, light-headedness, numbness and headaches.  Hematological: Negative for adenopathy. Does not bruise/bleed easily.  Psychiatric/Behavioral: Negative.  Negative for agitation, behavioral problems, confusion, dysphoric mood, hallucinations and sleep disturbance. The patient is not nervous/anxious.        Objective:   Physical Exam  Constitutional: She appears well-developed  and well-nourished. No distress.  HENT:  Head: Normocephalic and atraumatic.  Right Ear: External ear normal.  Left Ear: External ear normal.  Nose: Nose normal.  Mouth/Throat: Oropharynx is clear and moist. No oropharyngeal exudate.  Eyes: Conjunctivae and EOM are normal. Pupils are equal, round, and reactive to light. Right eye exhibits no discharge. Left eye exhibits no discharge. No scleral icterus.  Neck: Normal range of motion. Neck supple. No JVD present. No thyromegaly present.  Cardiovascular: Normal rate, regular rhythm, normal heart sounds and intact distal pulses.  Exam reveals no gallop and no friction rub.   No murmur heard. Pulmonary/Chest: Effort normal and breath sounds normal. No stridor. No respiratory distress. She has no wheezes. She has no rales. She exhibits no tenderness.  Abdominal: Soft. Normal appearance and bowel sounds are normal. She exhibits no distension, no abdominal bruit, no ascites and no mass. There is no hepatosplenomegaly. There is no tenderness. There is no rigidity, no rebound and no guarding. No hernia.  Genitourinary: Rectum normal, vagina normal and uterus normal. No breast swelling, tenderness, discharge or bleeding. Cervix exhibits no motion tenderness, no discharge and no friability. Right adnexum displays no mass, no tenderness and no fullness. Left adnexum displays no mass, no tenderness and no fullness. No erythema, tenderness or bleeding in the vagina. No vaginal discharge found.  Musculoskeletal: Normal range of motion. She exhibits no edema or tenderness.  Lymphadenopathy:    She has no cervical adenopathy.  Neurological: She is alert. She has normal reflexes. No cranial nerve deficit. She exhibits normal muscle tone. Coordination normal.  Skin: Skin is warm and dry. No rash noted. She is not diaphoretic. No erythema. No pallor.  Psychiatric: She has a normal mood and affect. Her behavior is normal. Judgment and thought content normal.  Assessment & Plan:  Well exam. We discussed diet and exercise. Get fasting labs today.  Alysia Penna, MD

## 2017-01-07 NOTE — Telephone Encounter (Signed)
Patient in 08 recall. Call to patient to schedule. Left message to return call to schedule AEX /PAP -eh

## 2017-01-07 NOTE — Progress Notes (Signed)
Pre visit review using our clinic review tool, if applicable. No additional management support is needed unless otherwise documented below in the visit note. 

## 2017-01-07 NOTE — Patient Instructions (Signed)
WE NOW OFFER   [facility]asmine [facility]'s FAST TRACK!!!  SAME DAY Appointments for ACUTE CARE  Such as: Sprains, Injuries, cuts, abrasions, rashes, muscle pain, joint pain, back pain Colds, flu, sore throats, headache, allergies, cough, fever  Ear pain, sinus and eye infections Abdominal pain, nausea, vomiting, diarrhea, upset stomach Animal/insect bites  3 Easy Ways to Schedule: Walk-In Scheduling Call in scheduling Mychart Sign-up: [email or web address]         

## 2017-01-07 NOTE — Addendum Note (Signed)
Addended by: Denna Haggard K on: 01/07/2017 11:20 AM   Modules accepted: Orders

## 2017-01-07 NOTE — Addendum Note (Signed)
Addended by: Aggie Hacker A on: 01/07/2017 10:52 AM   Modules accepted: Orders

## 2017-01-08 LAB — URINE CULTURE: Organism ID, Bacteria: NO GROWTH

## 2017-01-09 LAB — CYTOLOGY - PAP
Diagnosis: NEGATIVE
HPV: DETECTED — AB

## 2017-01-10 NOTE — Telephone Encounter (Signed)
Message left to return call to Emily at [phone].    

## 2017-01-16 NOTE — Telephone Encounter (Signed)
Patient has not returned call regarding 08 recall. Please advise on recall status/ letter  Thanks  

## 2017-01-17 NOTE — Telephone Encounter (Signed)
Pt saw Dr. Sarajane Jews on 01/07/17 and had a normal pap smear.  I communicated with him via EPIC to see if the HR HPV could be added on to the pap as she has not returned phone calls.  I'm going to send this back to you to hold onto and see if the HR HPV can be done and hopefully is negative.

## 2017-01-21 ENCOUNTER — Other Ambulatory Visit: Payer: Self-pay | Admitting: Family Medicine

## 2017-01-21 MED ORDER — AMLODIPINE BESYLATE 10 MG PO TABS: 10.0000 mg | ORAL_TABLET | Freq: Every day | ORAL | 3 refills | Status: DC

## 2017-01-21 MED ORDER — METFORMIN HCL 500 MG PO TABS: 500.0000 mg | ORAL_TABLET | Freq: Two times a day (BID) | ORAL | 3 refills | Status: DC

## 2017-01-21 MED ORDER — LISINOPRIL-HYDROCHLOROTHIAZIDE 20-12.5 MG PO TABS: 1.0000 | ORAL_TABLET | Freq: Two times a day (BID) | ORAL | 3 refills | Status: DC

## 2017-01-22 ENCOUNTER — Telehealth: Payer: Self-pay | Admitting: Family Medicine

## 2017-01-22 ENCOUNTER — Other Ambulatory Visit: Payer: Self-pay | Admitting: Family Medicine

## 2017-01-22 NOTE — Telephone Encounter (Signed)
Per Dr. Sarajane Jews order the add on HPV. I did call pathology department at [phone] and faxed over the add on sheet attention Peter Congo to [phone].

## 2017-01-22 NOTE — Telephone Encounter (Signed)
-----   Message from Laurey Morale, MD sent at 01/21/2017 10:06 PM EDT ----- Regarding: FW: follow up pap smear Please call the pathology lab and add High Risk HPV testing to the pap sample (hx of abnormal Pap)  ----- Message ----- From: Megan Salon, MD Sent: 01/17/2017  10:29 AM To: Laurey Morale, MD Subject: follow up pap smear                            Dr. Sarajane Jews, Last year you sent Mrs. Buckles to me for additional evaluation of her abnormal pap smear.  We have a recall system that we use to follow individuals to make sure they've had follow-up.  Mrs. Junker came to you on 3/36/18 for her follow up pap.  Her pap was normal.  She needs a repeat high risk HPV test as well.  We've tried to contact her to let her know this and see if she would come in to have it done.  She has not called back.   I'm wondering if you could just add the HR HPV testing onto the pap you did.  They hold the pap smears for 30 days so it should still be possible to do this test.  Sorry to bother you with this.  I am happy to just have her come in for this but she hasn't returned phone calls.    Thanks so much.  Edwinna Areola

## 2017-01-24 NOTE — Telephone Encounter (Signed)
I spoke with pt and sent over results from pap with add on test.

## 2017-01-24 NOTE — Telephone Encounter (Signed)
Pt returning your call. Pt states Sunday Spillers called her.

## 2017-01-24 NOTE — Telephone Encounter (Signed)
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 Please advise on follow up Thanks

## 2017-01-24 NOTE — Telephone Encounter (Signed)
Please notify pt that HR HPV was added to the pap smear done by Dr. Sarajane Jews and this was still positive.  She needs to have a repeat colposcopy.  Thanks.

## 2017-01-28 NOTE — Telephone Encounter (Signed)
Left message to call Starletta Houchin at [phone]. 

## 2017-01-28 NOTE — Telephone Encounter (Signed)
Sending to triage for results and scheduling of colposcopy -eh

## 2017-02-04 NOTE — Telephone Encounter (Signed)
Left message to call Kaitlyn at [phone]. 

## 2017-02-08 NOTE — Telephone Encounter (Signed)
Dr.Miller, I have attempted to reach this patient x 2 with no return call. Okay to send letter?

## 2017-02-09 NOTE — Telephone Encounter (Signed)
Yes, ok to send letter.  I will also send a note to Dr. Sharlene Motts that she has not returned phone calls.  Thanks.

## 2017-02-11 ENCOUNTER — Encounter: Payer: Self-pay | Admitting: *Deleted

## 2017-02-11 NOTE — Telephone Encounter (Signed)
Letter to [place] for review, advise, and signature before sending.

## 2017-02-12 NOTE — Telephone Encounter (Signed)
Certified letter mailed to home address on file.  Routing to provider for final review. Patient agreeable to disposition. Will close encounter.

## 2018-03-13 ENCOUNTER — Telehealth: Payer: Self-pay | Admitting: Family Medicine

## 2018-03-13 NOTE — Telephone Encounter (Signed)
Copied from [place] [id number]. Topic: Quick Communication - Rx Refill/Question >> Mar 13, 2018  2:28 PM Waylan Rocher, Lumin L wrote: Medication: amLODipine (NORVASC) 10 MG tablet, lisinopril-hydrochlorothiazide (PRINZIDE,ZESTORETIC) 20-12.5 MG table, metFORMIN (GLUCOPHAGE) 500 MG tablet  Has the patient contacted their pharmacy? Yes.   (Agent: If no, request that the patient contact the pharmacy for the refill.) (Agent: If yes, when and what did the pharmacy advise?)  Preferred Pharmacy (with phone number or street name): [id number] [address], [place] [address] Phone: [phone] Fax: [phone]  Agent: Please be advised that RX refills may take up to 3 business days. We ask that you follow-up with your pharmacy.

## 2018-03-14 ENCOUNTER — Other Ambulatory Visit: Payer: Self-pay | Admitting: *Deleted

## 2018-03-14 MED ORDER — AMLODIPINE BESYLATE 10 MG PO TABS: 10.0000 mg | ORAL_TABLET | Freq: Every day | ORAL | 0 refills | Status: DC

## 2018-03-14 MED ORDER — LISINOPRIL-HYDROCHLOROTHIAZIDE 20-12.5 MG PO TABS: 1.0000 | ORAL_TABLET | Freq: Two times a day (BID) | ORAL | 0 refills | Status: DC

## 2018-03-14 MED ORDER — METFORMIN HCL 500 MG PO TABS: 500.0000 mg | ORAL_TABLET | Freq: Two times a day (BID) | ORAL | 0 refills | Status: DC

## 2018-03-14 NOTE — Telephone Encounter (Signed)
Rx refilled per protocol- 1 refill with no additional. Patient has appointment - 03/31/18 ( LOV: 01/07/17)

## 2018-03-24 ENCOUNTER — Other Ambulatory Visit: Payer: Self-pay | Admitting: Family Medicine

## 2018-03-31 ENCOUNTER — Encounter: Payer: Managed Care, Other (non HMO) | Admitting: Family Medicine

## 2018-04-22 ENCOUNTER — Encounter: Payer: Self-pay | Admitting: Family Medicine

## 2018-04-22 DIAGNOSIS — Z0289 Encounter for other administrative examinations: Secondary | ICD-10-CM

## 2018-05-16 ENCOUNTER — Encounter: Payer: 59 | Admitting: Family Medicine

## 2018-06-14 ENCOUNTER — Other Ambulatory Visit: Payer: Self-pay | Admitting: Family Medicine

## 2018-06-25 ENCOUNTER — Telehealth: Payer: Self-pay

## 2018-06-25 ENCOUNTER — Encounter: Payer: Self-pay | Admitting: Family Medicine

## 2018-06-25 ENCOUNTER — Ambulatory Visit (INDEPENDENT_AMBULATORY_CARE_PROVIDER_SITE_OTHER): Payer: 59 | Admitting: Family Medicine

## 2018-06-25 VITALS — BP 120/82 | HR 99 | Temp 98.3°F | Ht 66.5 in | Wt 220.6 lb

## 2018-06-25 DIAGNOSIS — Z0000 Encounter for general adult medical examination without abnormal findings: Secondary | ICD-10-CM

## 2018-06-25 DIAGNOSIS — E119 Type 2 diabetes mellitus without complications: Secondary | ICD-10-CM

## 2018-06-25 LAB — BASIC METABOLIC PANEL
BUN: 17 mg/dL (ref 6–23)
CALCIUM: 9.9 mg/dL (ref 8.4–10.5)
CO2: 28 meq/L (ref 19–32)
Chloride: 104 mEq/L (ref 96–112)
Creatinine, Ser: 0.67 mg/dL (ref 0.40–1.20)
GFR: 119.52 mL/min (ref >60.00)
Glucose, Bld: 130 mg/dL — ABNORMAL HIGH (ref 70–99)
Potassium: 3.7 mEq/L (ref 3.5–5.1)
SODIUM: 140 meq/L (ref 135–145)

## 2018-06-25 LAB — HEPATIC FUNCTION PANEL
ALBUMIN: 3.9 g/dL (ref 3.5–5.2)
ALK PHOS: 69 U/L (ref 39–117)
ALT: 19 U/L (ref 0–35)
AST: 19 U/L (ref 0–37)
BILIRUBIN TOTAL: 1.1 mg/dL (ref 0.2–1.2)
Bilirubin, Direct: 0.2 mg/dL (ref 0.0–0.3)
Total Protein: 7.1 g/dL (ref 6.0–8.3)

## 2018-06-25 LAB — CBC WITH DIFFERENTIAL/PLATELET
BASOS ABS: 0 10*3/uL (ref 0.0–0.1)
Basophils Relative: 1 % (ref 0.0–3.0)
EOS ABS: 0.2 10*3/uL (ref 0.0–0.7)
Eosinophils Relative: 4.2 % (ref 0.0–5.0)
HCT: 34.7 % — ABNORMAL LOW (ref 36.0–46.0)
HEMOGLOBIN: 11.5 g/dL — AB (ref 12.0–15.0)
Lymphocytes Relative: 30.9 % (ref 12.0–46.0)
Lymphs Abs: 1.3 10*3/uL (ref 0.7–4.0)
MCHC: 33.1 g/dL (ref 30.0–36.0)
MCV: 94.2 fl (ref 78.0–100.0)
MONOS PCT: 10.2 % (ref 3.0–12.0)
Monocytes Absolute: 0.4 10*3/uL (ref 0.1–1.0)
Neutro Abs: 2.3 10*3/uL (ref 1.4–7.7)
Neutrophils Relative %: 53.7 % (ref 43.0–77.0)
Platelets: 279 10*3/uL (ref 150.0–400.0)
RBC: 3.69 Mil/uL — ABNORMAL LOW (ref 3.87–5.11)
RDW: 13.3 % (ref 11.5–15.5)
WBC: 4.3 10*3/uL (ref 4.0–10.5)

## 2018-06-25 LAB — POC URINALSYSI DIPSTICK (AUTOMATED)
BILIRUBIN UA: NEGATIVE
GLUCOSE UA: NEGATIVE
Ketones, UA: NEGATIVE
LEUKOCYTES UA: NEGATIVE
NITRITE UA: NEGATIVE
PH UA: 5.5 (ref 5.0–8.0)
Protein, UA: POSITIVE — AB
RBC UA: NEGATIVE
Spec Grav, UA: 1.025 (ref 1.010–1.025)
UROBILINOGEN UA: 0.2 U/dL

## 2018-06-25 LAB — LIPID PANEL
Cholesterol: 230 mg/dL — ABNORMAL HIGH (ref 0–200)
HDL: 75.8 mg/dL (ref >39.00)
LDL CALC: 137 mg/dL — AB (ref 0–99)
NONHDL: 154.04
Total CHOL/HDL Ratio: 3
Triglycerides: 83 mg/dL (ref 0.0–149.0)
VLDL: 16.6 mg/dL (ref 0.0–40.0)

## 2018-06-25 LAB — HEMOGLOBIN A1C: Hgb A1c MFr Bld: 6.3 % (ref 4.6–6.5)

## 2018-06-25 LAB — TSH: TSH: 2.48 u[IU]/mL (ref 0.35–4.50)

## 2018-06-25 MED ORDER — METFORMIN HCL 500 MG PO TABS: 500.0000 mg | ORAL_TABLET | Freq: Two times a day (BID) | ORAL | 3 refills | Status: DC

## 2018-06-25 MED ORDER — LISINOPRIL-HYDROCHLOROTHIAZIDE 20-12.5 MG PO TABS: 1.0000 | ORAL_TABLET | Freq: Two times a day (BID) | ORAL | 3 refills | Status: DC

## 2018-06-25 MED ORDER — AMLODIPINE BESYLATE 10 MG PO TABS: 10.0000 mg | ORAL_TABLET | Freq: Every day | ORAL | 3 refills | Status: DC

## 2018-06-25 NOTE — Progress Notes (Signed)
   Subjective:    Patient ID: Jasmine Matthews, female    DOB: [birth year], 52 y.o.   MRN: [id number]  HPI Here for a well exam. She feels fine. She walks 3-4 days a week. She has a mammogram and an eye exam coming up soon.    Review of Systems  Constitutional: Negative.   HENT: Negative.   Eyes: Negative.   Respiratory: Negative.   Cardiovascular: Negative.   Gastrointestinal: Negative.   Genitourinary: Negative for decreased urine volume, difficulty urinating, dyspareunia, dysuria, enuresis, flank pain, frequency, hematuria, pelvic pain and urgency.  Musculoskeletal: Negative.   Skin: Negative.   Neurological: Negative.   Psychiatric/Behavioral: Negative.        Objective:   Physical Exam  Constitutional: She is oriented to person, place, and time. She appears well-developed and well-nourished. No distress.  HENT:  Head: Normocephalic and atraumatic.  Right Ear: External ear normal.  Left Ear: External ear normal.  Nose: Nose normal.  Mouth/Throat: Oropharynx is clear and moist. No oropharyngeal exudate.  Eyes: Pupils are equal, round, and reactive to light. Conjunctivae and EOM are normal. No scleral icterus.  Neck: Normal range of motion. Neck supple. No JVD present. No thyromegaly present.  Cardiovascular: Normal rate, regular rhythm, normal heart sounds and intact distal pulses. Exam reveals no gallop and no friction rub.  No murmur heard. Pulmonary/Chest: Effort normal and breath sounds normal. No respiratory distress. She has no wheezes. She has no rales. She exhibits no tenderness.  Abdominal: Soft. Bowel sounds are normal. She exhibits no distension and no mass. There is no tenderness. There is no rebound and no guarding.  Musculoskeletal: Normal range of motion. She exhibits no edema or tenderness.  Lymphadenopathy:    She has no cervical adenopathy.  Neurological: She is alert and oriented to person, place, and time. She has normal reflexes. She displays normal  reflexes. No cranial nerve deficit. She exhibits normal muscle tone. Coordination normal.  Skin: Skin is warm and dry. No rash noted. No erythema.  Psychiatric: She has a normal mood and affect. Her behavior is normal. Judgment and thought content normal.          Assessment & Plan:  Well exam. We discussed diet and exercise. Get fasting labs. Set up a colonoscopy.  Alysia Penna, MD

## 2018-06-25 NOTE — Telephone Encounter (Signed)
Wellness form has been placed in your RED folder   Fax once done and call pt to advise

## 2018-06-26 NOTE — Telephone Encounter (Signed)
Rx has been faxed. Called pt to advised. Unable to speak with pt left a VM to advise. Copy placed to be scanned into pt's chart.

## 2018-08-20 ENCOUNTER — Encounter: Payer: Self-pay | Admitting: Gastroenterology

## 2018-08-20 ENCOUNTER — Other Ambulatory Visit: Payer: Self-pay | Admitting: Family Medicine

## 2018-08-20 DIAGNOSIS — Z1231 Encounter for screening mammogram for malignant neoplasm of breast: Secondary | ICD-10-CM

## 2018-09-18 ENCOUNTER — Encounter: Payer: Self-pay | Admitting: Gastroenterology

## 2018-09-18 ENCOUNTER — Ambulatory Visit (AMBULATORY_SURGERY_CENTER): Payer: Self-pay

## 2018-09-18 VITALS — Ht 68.0 in | Wt 226.0 lb

## 2018-09-18 DIAGNOSIS — Z1211 Encounter for screening for malignant neoplasm of colon: Secondary | ICD-10-CM

## 2018-09-18 MED ORDER — NA SULFATE-K SULFATE-MG SULF 17.5-3.13-1.6 GM/177ML PO SOLN: 1.0000 | Freq: Once | ORAL | 0 refills | Status: AC

## 2018-09-18 NOTE — Progress Notes (Signed)
Denies allergies to eggs or soy products. Denies complication of anesthesia or sedation. Denies use of weight loss medication. Denies use of O2.   Emmi instructions declined.   A 15.00 coupon for Suprep was given to the patient.

## 2018-09-30 ENCOUNTER — Telehealth: Payer: Self-pay | Admitting: Gastroenterology

## 2018-09-30 NOTE — Telephone Encounter (Signed)
Pt needed to know when to take her Metformin- instructed she can take it WED as normal, Do NOT take it Thursday morning- she verbalized understanding- pt asked if she can drink liquids until 730 am Thursday- instructed yes, any clear liquid until 730 am Thursday then NOTHING after 730 am --- I instructed her to drink some liquids with sugar to maintain her Blood Sugar level wed and thurs morning before 730 am-  Pt verbalized understanding   Lelan Pons PV

## 2018-10-01 ENCOUNTER — Ambulatory Visit
Admission: RE | Admit: 2018-10-01 | Discharge: 2018-10-01 | Disposition: A | Discharge status: Home / Self Care | Payer: 59 | Source: Ambulatory Visit | Attending: Family Medicine | Admitting: Family Medicine

## 2018-10-01 DIAGNOSIS — Z1231 Encounter for screening mammogram for malignant neoplasm of breast: Secondary | ICD-10-CM | POA: Diagnosis not present

## 2018-10-02 ENCOUNTER — Ambulatory Visit (AMBULATORY_SURGERY_CENTER): Payer: 59 | Admitting: Gastroenterology

## 2018-10-02 ENCOUNTER — Encounter: Payer: Self-pay | Admitting: Gastroenterology

## 2018-10-02 VITALS — BP 128/80 | HR 57 | Temp 96.6°F | Resp 18 | Ht 65.5 in | Wt 220.0 lb

## 2018-10-02 DIAGNOSIS — D128 Benign neoplasm of rectum: Secondary | ICD-10-CM | POA: Diagnosis not present

## 2018-10-02 DIAGNOSIS — Z1211 Encounter for screening for malignant neoplasm of colon: Secondary | ICD-10-CM | POA: Diagnosis not present

## 2018-10-02 DIAGNOSIS — D123 Benign neoplasm of transverse colon: Secondary | ICD-10-CM

## 2018-10-02 DIAGNOSIS — K621 Rectal polyp: Secondary | ICD-10-CM

## 2018-10-02 HISTORY — PX: COLONOSCOPY: SHX174

## 2018-10-02 MED ORDER — SODIUM CHLORIDE 0.9 % IV SOLN: 500.0000 mL | Freq: Once | INTRAVENOUS | Status: DC

## 2018-10-02 NOTE — Progress Notes (Signed)
To PACU, VSS. Report to [email or web address] 

## 2018-10-02 NOTE — Progress Notes (Signed)
Called to room to assist during endoscopic procedure.  Patient ID and intended procedure confirmed with present staff. Received instructions for my participation in the procedure from the performing physician.  

## 2018-10-02 NOTE — Progress Notes (Signed)
Pt's states no medical or surgical changes since previsit or office visit. 

## 2018-10-02 NOTE — Op Note (Addendum)
[facility] Patient Name: Jasmine Matthews Procedure Date: 10/02/2018 10:46 AM MRN: [id number] Endoscopist: Mauri Pole , MD Age: 52 Referring MD:  Date of Birth: February 08, 1966 Gender: Female Account #: [id number] Procedure:                Colonoscopy Indications:              Screening for colorectal malignant neoplasm, This                            is the patient's first colonoscopy Medicines:                Monitored Anesthesia Care Procedure:                Pre-Anesthesia Assessment:                           - Prior to the procedure, a History and Physical                            was performed, and patient medications and                            allergies were reviewed. The patient's tolerance of                            previous anesthesia was also reviewed. The risks                            and benefits of the procedure and the sedation                            options and risks were discussed with the patient.                            All questions were answered, and informed consent                            was obtained. Prior Anticoagulants: The patient has                            taken no previous anticoagulant or antiplatelet                            agents. ASA Grade Assessment: II - A patient with                            mild systemic disease. After reviewing the risks                            and benefits, the patient was deemed in                            satisfactory condition to undergo the procedure.  After obtaining informed consent, the colonoscope                            was passed under direct vision. Throughout the                            procedure, the patient's blood pressure, pulse, and                            oxygen saturations were monitored continuously. The                            Model PCF-H190DL [id number]) scope was introduced                            through the anus  and advanced to the the cecum,                            identified by appendiceal orifice and ileocecal                            valve. The colonoscopy was performed without                            difficulty. The patient tolerated the procedure                            well. The quality of the bowel preparation was                            good. The ileocecal valve, appendiceal orifice, and                            rectum were photographed. Scope In: 10:57:16 AM Scope Out: 11:12:18 AM Scope Withdrawal Time: 0 hours 10 minutes 36 seconds  Total Procedure Duration: 0 hours 15 minutes 2 seconds  Findings:                 The perianal and digital rectal examinations were                            normal.                           Two sessile polyps were found in the rectum and                            transverse colon. The polyps were 4 to 6 mm in                            size. These polyps were removed with a cold snare.                            Resection and retrieval were complete.  Multiple small and large-mouthed diverticula were                            found in the sigmoid colon, ascending colon and                            cecum.                           Non-bleeding internal hemorrhoids were found during                            retroflexion. The hemorrhoids were medium-sized. Complications:            No immediate complications. Estimated Blood Loss:     Estimated blood loss was minimal. Impression:               - Two 4 to 6 mm polyps in the rectum and in the                            transverse colon, removed with a cold snare.                            Resected and retrieved.                           - Diverticulosis in the sigmoid colon, in the                            ascending colon and in the cecum.                           - Non-bleeding internal hemorrhoids. Recommendation:           - Patient has a contact number  available for                            emergencies. The signs and symptoms of potential                            delayed complications were discussed with the                            patient. Return to normal activities tomorrow.                            Written discharge instructions were provided to the                            patient.                           - Resume previous diet.                           - Continue present medications.                           -  Await pathology results.                           - Repeat colonoscopy in 5-10 years for surveillance                            based on pathology results. Mauri Pole, MD 10/02/2018 11:19:20 AM This report has been signed electronically.

## 2018-10-02 NOTE — Patient Instructions (Signed)
Patient given handouts on polyps and diverticulosis.  YOU HAD AN ENDOSCOPIC PROCEDURE TODAY AT [facility]:   Refer to the procedure report that was given to you for any specific questions about what was found during the examination.  If the procedure report does not answer your questions, please call your gastroenterologist to clarify.  If you requested that your care partner not be given the details of your procedure findings, then the procedure report has been included in a sealed envelope for you to review at your convenience later.  YOU SHOULD EXPECT: Some feelings of bloating in the abdomen. Passage of more gas than usual.  Walking can help get rid of the air that was put into your GI tract during the procedure and reduce the bloating. If you had a lower endoscopy (such as a colonoscopy or flexible sigmoidoscopy) you may notice spotting of blood in your stool or on the toilet paper. If you underwent a bowel prep for your procedure, you may not have a normal bowel movement for a few days.  Please Note:  You might notice some irritation and congestion in your nose or some drainage.  This is from the oxygen used during your procedure.  There is no need for concern and it should clear up in a day or so.  SYMPTOMS TO REPORT IMMEDIATELY:   Following lower endoscopy (colonoscopy or flexible sigmoidoscopy):  Excessive amounts of blood in the stool  Significant tenderness or worsening of abdominal pains  Swelling of the abdomen that is new, acute  Fever of 100F or higher   Following upper endoscopy (EGD)  Vomiting of blood or coffee ground material  New chest pain or pain under the shoulder blades  Painful or persistently difficult swallowing  New shortness of breath  Fever of 100F or higher  Black, tarry-looking stools  For urgent or emergent issues, a gastroenterologist can be reached at any hour by calling [phone].   DIET:  We do recommend a small meal at first,  but then you may proceed to your regular diet.  Drink plenty of fluids but you should avoid alcoholic beverages for 24 hours.  ACTIVITY:  You should plan to take it easy for the rest of today and you should NOT DRIVE or use heavy machinery until tomorrow (because of the sedation medicines used during the test).    FOLLOW UP: Our staff will call the number listed on your records the next business day following your procedure to check on you and address any questions or concerns that you may have regarding the information given to you following your procedure. If we do not reach you, we will leave a message.  However, if you are feeling well and you are not experiencing any problems, there is no need to return our call.  We will assume that you have returned to your regular daily activities without incident.  If any biopsies were taken you will be contacted by phone or by letter within the next 1-3 weeks.  Please call us at [phone] if you have not heard about the biopsies in 3 weeks.    SIGNATURES/CONFIDENTIALITY: You and/or your care partner have signed paperwork which will be entered into your electronic medical record.  These signatures attest to the fact that that the information above on your After Visit Summary has been reviewed and is understood.  Full responsibility of the confidentiality of this discharge information lies with you and/or your care-partner.

## 2018-10-03 ENCOUNTER — Telehealth: Payer: Self-pay | Admitting: *Deleted

## 2018-10-03 NOTE — Telephone Encounter (Signed)
  Follow up Call-  Call back number 10/02/2018  Post procedure Call Back phone  # [phone]  Permission to leave phone message Yes  Some recent data might be hidden     Patient questions:  Do you have a fever, pain , or abdominal swelling? No. Pain Score  0 *  Have you tolerated food without any problems? Yes.    Have you been able to return to your normal activities? Yes.    Do you have any questions about your discharge instructions: Diet   No. Medications  No. Follow up visit  No.  Do you have questions or concerns about your Care? No.  Actions: * If pain score is 4 or above: No action needed, pain <4.

## 2018-10-07 ENCOUNTER — Encounter: Payer: Self-pay | Admitting: Gastroenterology

## 2019-08-09 ENCOUNTER — Other Ambulatory Visit: Payer: Self-pay | Admitting: Family Medicine

## 2019-09-02 ENCOUNTER — Encounter: Payer: Self-pay | Admitting: Family Medicine

## 2019-09-02 ENCOUNTER — Other Ambulatory Visit: Payer: Self-pay

## 2019-09-02 ENCOUNTER — Ambulatory Visit (INDEPENDENT_AMBULATORY_CARE_PROVIDER_SITE_OTHER): Payer: 59 | Admitting: Family Medicine

## 2019-09-02 VITALS — BP 138/88 | HR 60 | Temp 97.8°F | Ht 65.5 in | Wt 224.0 lb

## 2019-09-02 DIAGNOSIS — Z0000 Encounter for general adult medical examination without abnormal findings: Secondary | ICD-10-CM

## 2019-09-02 DIAGNOSIS — E119 Type 2 diabetes mellitus without complications: Secondary | ICD-10-CM

## 2019-09-02 LAB — BASIC METABOLIC PANEL
BUN: 18 mg/dL (ref 6–23)
CO2: 28 mEq/L (ref 19–32)
Calcium: 9.4 mg/dL (ref 8.4–10.5)
Chloride: 103 mEq/L (ref 96–112)
Creatinine, Ser: 0.66 mg/dL (ref 0.40–1.20)
GFR: 113.89 mL/min (ref >60.00)
Glucose, Bld: 117 mg/dL — ABNORMAL HIGH (ref 70–99)
Potassium: 3.6 mEq/L (ref 3.5–5.1)
Sodium: 141 mEq/L (ref 135–145)

## 2019-09-02 LAB — LIPID PANEL
Cholesterol: 269 mg/dL — ABNORMAL HIGH (ref 0–200)
HDL: 83.7 mg/dL (ref >39.00)
LDL Cholesterol: 169 mg/dL — ABNORMAL HIGH (ref 0–99)
NonHDL: 185.7
Total CHOL/HDL Ratio: 3
Triglycerides: 83 mg/dL (ref 0.0–149.0)
VLDL: 16.6 mg/dL (ref 0.0–40.0)

## 2019-09-02 LAB — HEMOGLOBIN A1C: Hgb A1c MFr Bld: 6.3 % (ref 4.6–6.5)

## 2019-09-02 LAB — HEPATIC FUNCTION PANEL
ALT: 13 U/L (ref 0–35)
AST: 16 U/L (ref 0–37)
Albumin: 4 g/dL (ref 3.5–5.2)
Alkaline Phosphatase: 73 U/L (ref 39–117)
Bilirubin, Direct: 0.2 mg/dL (ref 0.0–0.3)
Total Bilirubin: 0.9 mg/dL (ref 0.2–1.2)
Total Protein: 7.1 g/dL (ref 6.0–8.3)

## 2019-09-02 LAB — CBC WITH DIFFERENTIAL/PLATELET
Basophils Absolute: 0 10*3/uL (ref 0.0–0.1)
Basophils Relative: 0.7 % (ref 0.0–3.0)
Eosinophils Absolute: 0.2 10*3/uL (ref 0.0–0.7)
Eosinophils Relative: 4.6 % (ref 0.0–5.0)
HCT: 34.5 % — ABNORMAL LOW (ref 36.0–46.0)
Hemoglobin: 11.4 g/dL — ABNORMAL LOW (ref 12.0–15.0)
Lymphocytes Relative: 42.1 % (ref 12.0–46.0)
Lymphs Abs: 2.2 10*3/uL (ref 0.7–4.0)
MCHC: 32.9 g/dL (ref 30.0–36.0)
MCV: 96.3 fl (ref 78.0–100.0)
Monocytes Absolute: 0.4 10*3/uL (ref 0.1–1.0)
Monocytes Relative: 8.3 % (ref 3.0–12.0)
Neutro Abs: 2.3 10*3/uL (ref 1.4–7.7)
Neutrophils Relative %: 44.3 % (ref 43.0–77.0)
Platelets: 245 10*3/uL (ref 150.0–400.0)
RBC: 3.59 Mil/uL — ABNORMAL LOW (ref 3.87–5.11)
RDW: 13.2 % (ref 11.5–15.5)
WBC: 5.2 10*3/uL (ref 4.0–10.5)

## 2019-09-02 LAB — TSH: TSH: 3.08 u[IU]/mL (ref 0.35–4.50)

## 2019-09-02 NOTE — Progress Notes (Signed)
   Subjective:    Patient ID: Jasmine Matthews, female    DOB: Mar 20, 1966, 53 y.o.   MRN: [id number]  HPI Here for a well exam. She feels fine. Her glucose readings at home have averaged 130-140.   Review of Systems  Constitutional: Negative.   HENT: Negative.   Eyes: Negative.   Respiratory: Negative.   Cardiovascular: Negative.   Gastrointestinal: Negative.   Genitourinary: Negative for decreased urine volume, difficulty urinating, dyspareunia, dysuria, enuresis, flank pain, frequency, hematuria, pelvic pain and urgency.  Musculoskeletal: Negative.   Skin: Negative.   Neurological: Negative.   Psychiatric/Behavioral: Negative.        Objective:   Physical Exam Constitutional:      General: She is not in acute distress.    Appearance: She is well-developed.  HENT:     Head: Normocephalic and atraumatic.     Right Ear: External ear normal.     Left Ear: External ear normal.     Nose: Nose normal.     Mouth/Throat:     Pharynx: No oropharyngeal exudate.  Eyes:     General: No scleral icterus.    Conjunctiva/sclera: Conjunctivae normal.     Pupils: Pupils are equal, round, and reactive to light.  Neck:     Musculoskeletal: Normal range of motion and neck supple.     Thyroid: No thyromegaly.     Vascular: No JVD.  Cardiovascular:     Rate and Rhythm: Normal rate and regular rhythm.     Heart sounds: Normal heart sounds. No murmur. No friction rub. No gallop.   Pulmonary:     Effort: Pulmonary effort is normal. No respiratory distress.     Breath sounds: Normal breath sounds. No wheezing or rales.  Chest:     Chest wall: No tenderness.  Abdominal:     General: Bowel sounds are normal. There is no distension.     Palpations: Abdomen is soft. There is no mass.     Tenderness: There is no abdominal tenderness. There is no guarding or rebound.  Musculoskeletal: Normal range of motion.        General: No tenderness.  Lymphadenopathy:     Cervical: No cervical adenopathy.   Skin:    General: Skin is warm and dry.     Findings: No erythema or rash.  Neurological:     Mental Status: She is alert and oriented to person, place, and time.     Cranial Nerves: No cranial nerve deficit.     Motor: No abnormal muscle tone.     Coordination: Coordination normal.     Deep Tendon Reflexes: Reflexes are normal and symmetric. Reflexes normal.  Psychiatric:        Behavior: Behavior normal.        Thought Content: Thought content normal.        Judgment: Judgment normal.           Assessment & Plan:  Well exam. We discussed diet and exercise. Get fasting labs.  Alysia Penna, MD

## 2019-09-02 NOTE — Patient Instructions (Addendum)
Health Maintenance Due  Topic Date Due  . PNEUMOCOCCAL POLYSACCHARIDE VACCINE AGE 51-64 HIGH RISK  11/14/1969  . FOOT EXAM  11/14/1977  . OPHTHALMOLOGY EXAM  11/14/1977  . HIV Screening  11/14/1982  . TETANUS/TDAP  11/14/1986  . HEMOGLOBIN A1C  12/24/2018  . INFLUENZA VACCINE  05/16/2019    No flowsheet data found.

## 2019-09-03 MED ORDER — ATORVASTATIN CALCIUM 20 MG PO TABS: 20.0000 mg | ORAL_TABLET | Freq: Every day | ORAL | 3 refills | Status: DC

## 2019-09-03 NOTE — Addendum Note (Signed)
Addended by: Gwenyth Ober R on: 09/03/2019 11:19 AM   Modules accepted: Orders

## 2019-10-07 IMAGING — MG DIGITAL SCREENING BILATERAL MAMMOGRAM WITH TOMO AND CAD
8 series · 8 of 24 positions shown · non-contrast
Comparison: Previous exam(s).

ACR Breast Density Category a: The breast tissue is almost entirely
fatty.

CLINICAL DATA: Screening.

EXAM:
DIGITAL SCREENING BILATERAL MAMMOGRAM WITH TOMO AND CAD

[R MLO synth-2D]
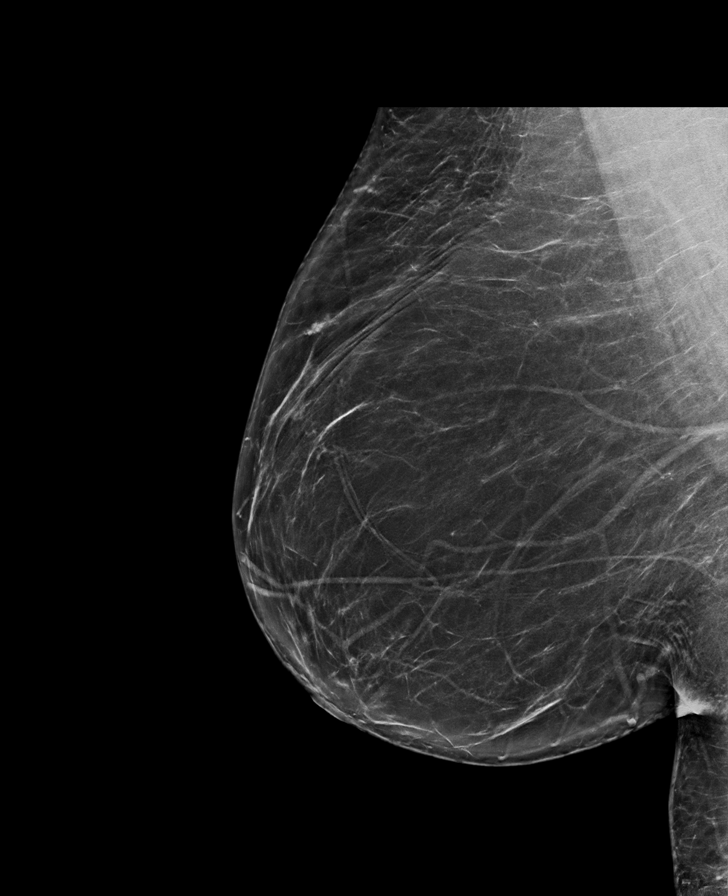

[L CC synth-2D]
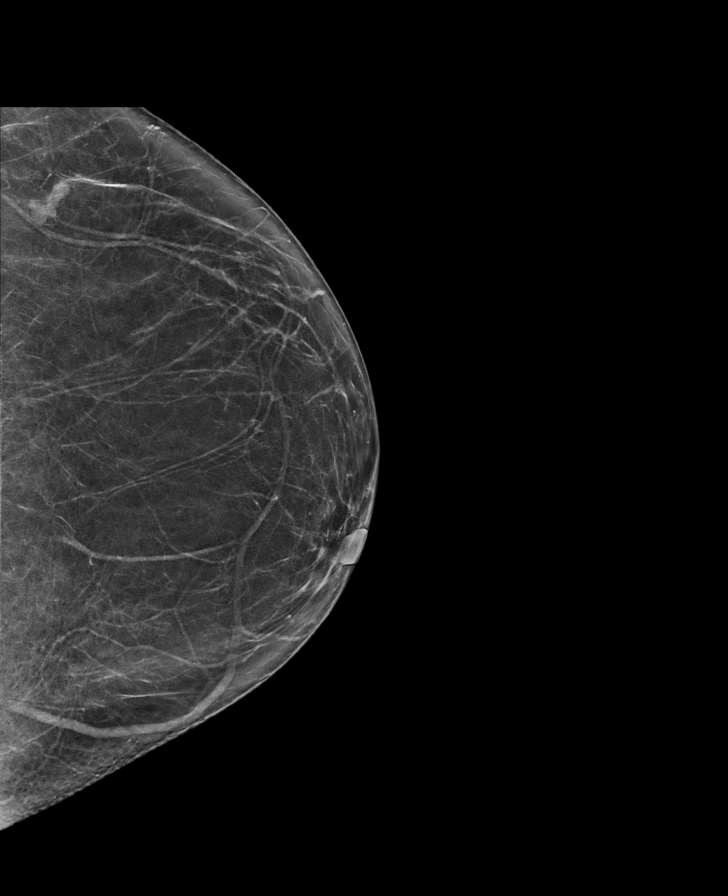

[R CC synth-2D]
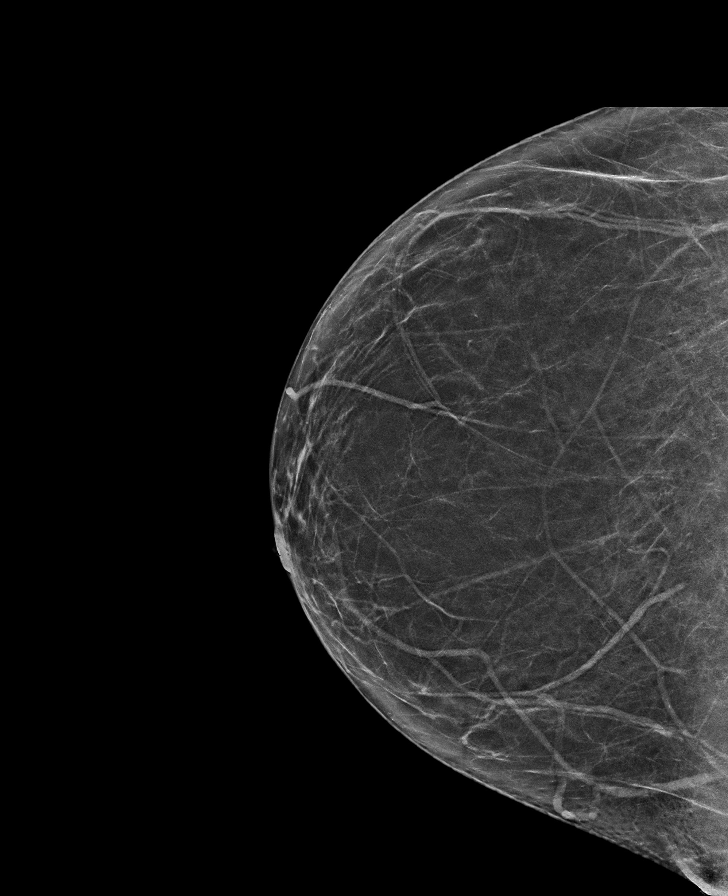

[L MLO synth-2D]
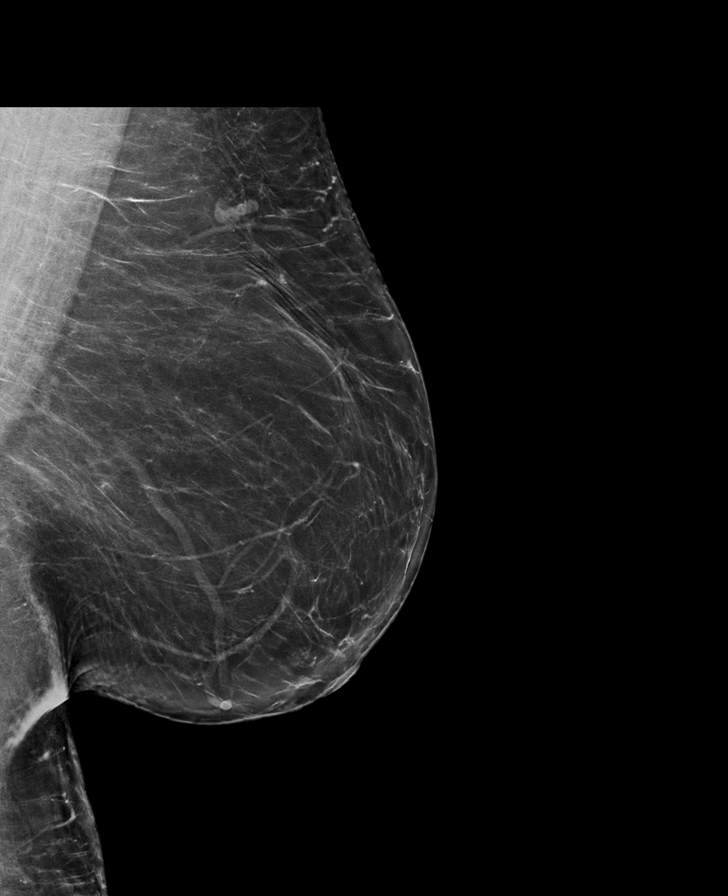

[L CC tomo · tomo slice 35/68.0]
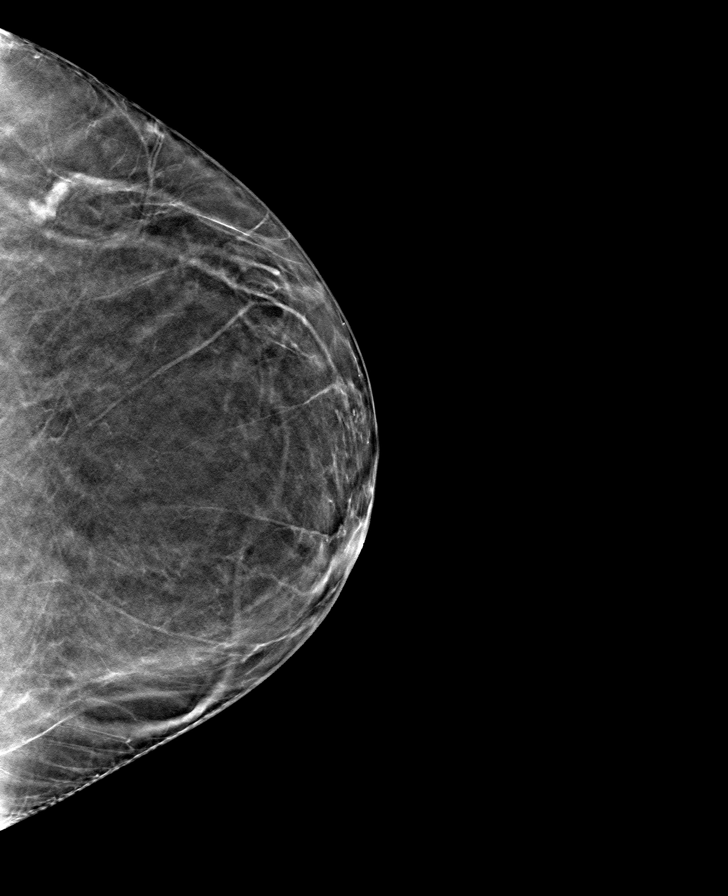

[L MLO tomo · tomo slice 43/85.0]
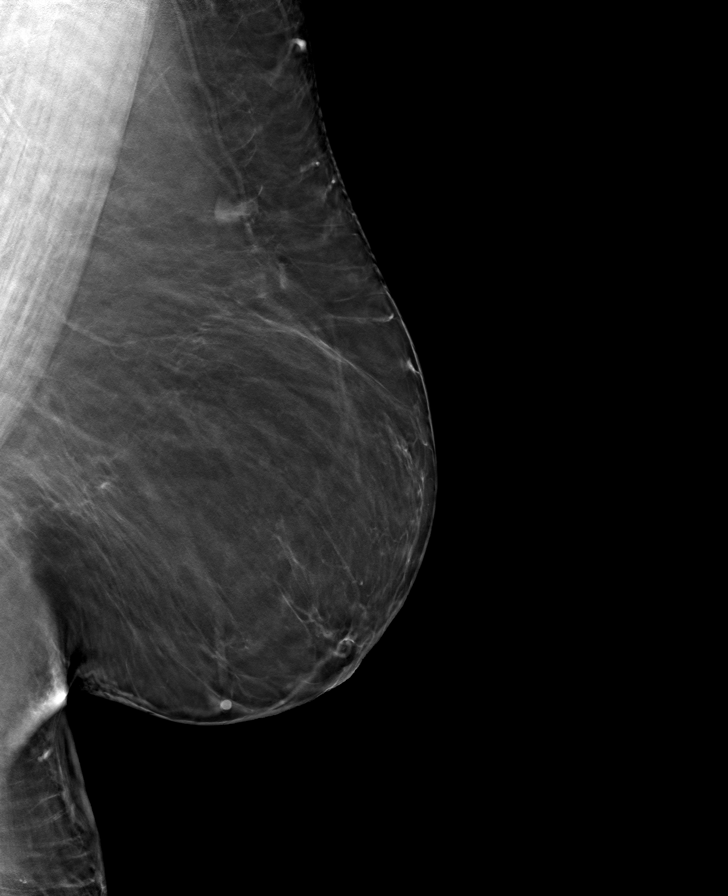

[R CC tomo · tomo slice 33/65.0]
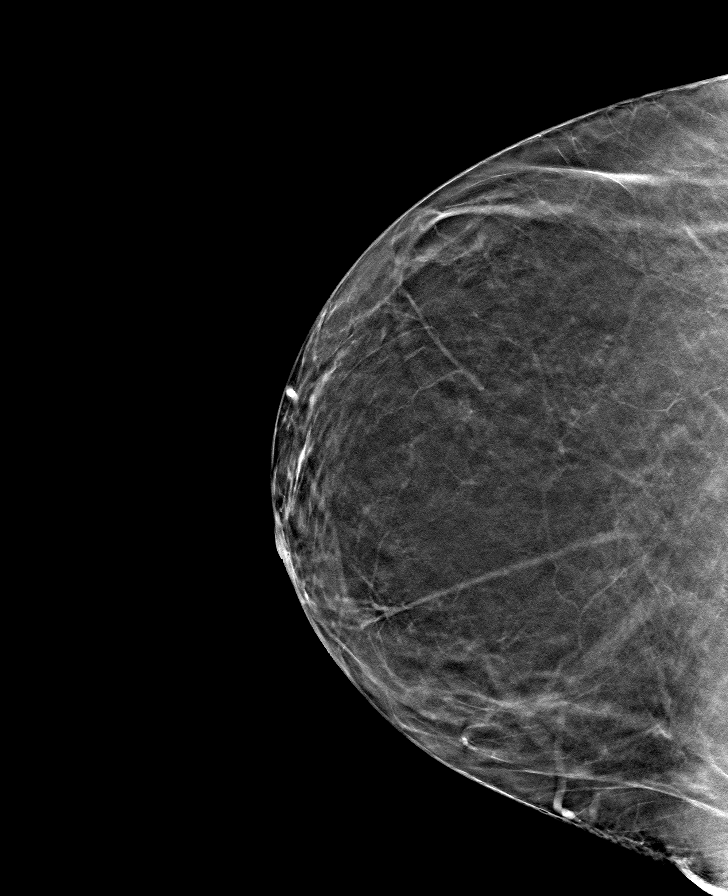

[R MLO tomo · tomo slice 41/81.0]
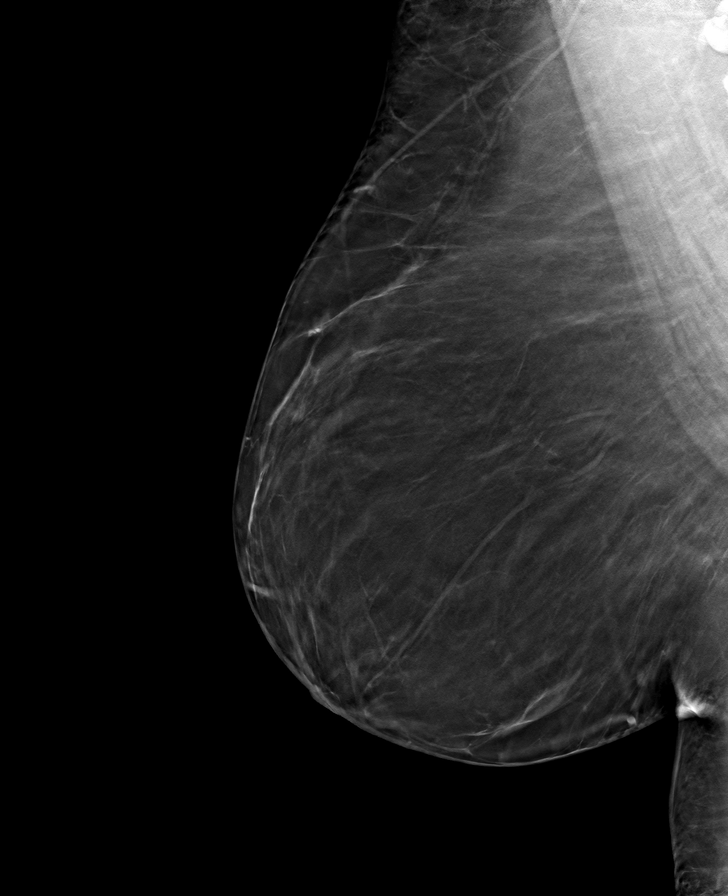

[8 of 24 positions shown; findings below may reference images not displayed]

FINDINGS: There are no findings suspicious for malignancy. Images were
processed with CAD.
IMPRESSION: No mammographic evidence of malignancy. A result letter of this
screening mammogram will be mailed directly to the patient.

RECOMMENDATION:
Screening mammogram in one year. (Code:[id number])

BI-RADS CATEGORY  1: Negative.

## 2020-01-18 ENCOUNTER — Ambulatory Visit: Payer: 59

## 2020-07-08 DIAGNOSIS — Y30XXXA Falling, jumping or pushed from a high place, undetermined intent, initial encounter: Secondary | ICD-10-CM | POA: Diagnosis not present

## 2020-07-08 DIAGNOSIS — M25522 Pain in left elbow: Secondary | ICD-10-CM | POA: Diagnosis not present

## 2020-07-08 DIAGNOSIS — M25512 Pain in left shoulder: Secondary | ICD-10-CM | POA: Diagnosis not present

## 2020-07-08 DIAGNOSIS — S42292A Other displaced fracture of upper end of left humerus, initial encounter for closed fracture: Secondary | ICD-10-CM | POA: Diagnosis not present

## 2020-07-13 ENCOUNTER — Other Ambulatory Visit: Payer: Self-pay | Admitting: Orthopedic Surgery

## 2020-07-13 DIAGNOSIS — S4292XA Fracture of left shoulder girdle, part unspecified, initial encounter for closed fracture: Secondary | ICD-10-CM | POA: Diagnosis not present

## 2020-07-15 ENCOUNTER — Ambulatory Visit: Payer: Self-pay | Admitting: Family Medicine

## 2020-07-15 NOTE — Patient Instructions (Signed)
DUE TO COVID-19 ONLY ONE VISITOR IS ALLOWED TO COME WITH YOU AND STAY IN THE WAITING ROOM ONLY DURING PRE OP AND PROCEDURE DAY OF SURGERY. THE 1 VISITOR  MAY VISIT WITH YOU AFTER SURGERY IN YOUR PRIVATE ROOM DURING VISITING HOURS ONLY!  YOU NEED TO HAVE A COVID 19 TEST ON: 07/18/20 @ 2:20 PM, THIS TEST MUST BE DONE BEFORE SURGERY,  COVID TESTING SITE [place] [address], IT IS ON THE RIGHT GOING OUT [address] APPROXIMATELY  2 MINUTES PAST [employer] ON THE RIGHT. ONCE YOUR COVID TEST IS COMPLETED,  PLEASE BEGIN THE QUARANTINE INSTRUCTIONS AS OUTLINED IN YOUR HANDOUT.                Jasmine Matthews    Your procedure is scheduled on: 07/21/20   Report to [facility] Main  Entrance   Report to admitting at: 7:45 AM     Call this number if you have problems the morning of surgery [phone]    Remember:   NO SOLID FOOD AFTER MIDNIGHT THE NIGHT PRIOR TO SURGERY. NOTHING BY MOUTH EXCEPT CLEAR LIQUIDS UNTIL: 6:45 AM . PLEASE FINISH GATORADE DRINK PER SURGEON ORDER  WHICH NEEDS TO BE COMPLETED AT: 6:45 AM .  CLEAR LIQUID DIET   Foods Allowed                                                                     Foods Excluded  Coffee and tea, regular and decaf                             liquids that you cannot  Plain Jell-O any favor except red or purple                                           see through such as: Fruit ices (not with fruit pulp)                                     milk, soups, orange juice  Iced Popsicles                                    All solid food Carbonated beverages, regular and diet                                    Cranberry, grape and apple juices Sports drinks like Gatorade Lightly seasoned clear broth or consume(fat free) Sugar, honey syrup  Sample Menu Breakfast                                Lunch  Supper Cranberry juice                    Beef broth                             Chicken broth Jell-O                                     Grape juice                           Apple juice Coffee or tea                        Jell-O                                      Popsicle                                                Coffee or tea                        Coffee or tea  _____________________________________________________________________   BRUSH YOUR TEETH MORNING OF SURGERY AND RINSE YOUR MOUTH OUT, NO CHEWING GUM CANDY OR MINTS.     Take these medicines the morning of surgery with A SIP OF WATER: amlodipine.  How to Manage Your Diabetes Before and After Surgery  Why is it important to control my blood sugar before and after surgery? . Improving blood sugar levels before and after surgery helps healing and can limit problems. . A way of improving blood sugar control is eating a healthy diet by: o  Eating less sugar and carbohydrates o  Increasing activity/exercise o  Talking with your doctor about reaching your blood sugar goals . High blood sugars (greater than 180 mg/dL) can raise your risk of infections and slow your recovery, so you will need to focus on controlling your diabetes during the weeks before surgery. . Make sure that the doctor who takes care of your diabetes knows about your planned surgery including the date and location.  How do I manage my blood sugar before surgery? . Check your blood sugar at least 4 times a day, starting 2 days before surgery, to make sure that the level is not too high or low. o Check your blood sugar the morning of your surgery when you wake up and every 2 hours until you get to the Short Stay unit. . If your blood sugar is less than 70 mg/dL, you will need to treat for low blood sugar: o Do not take insulin. o Treat a low blood sugar (less than 70 mg/dL) with  cup of clear juice (cranberry or apple), 4 glucose tablets, OR glucose gel. o Recheck blood sugar in 15 minutes after treatment (to make sure it is greater  than 70 mg/dL). If your blood sugar is not greater than 70 mg/dL on recheck, call [phone] for further instructions. . Report your blood sugar to the short stay nurse when you get to Short Stay.  . If you  are admitted to the hospital after surgery: o Your blood sugar will be checked by the staff and you will probably be given insulin after surgery (instead of oral diabetes medicines) to make sure you have good blood sugar levels. o The goal for blood sugar control after surgery is 80-180 mg/dL.   WHAT DO I DO ABOUT MY DIABETES MEDICATION?  Marland Kitchen Do not take oral diabetes medicines (pills) the morning of surgery.  . THE DAY BEFORE SURGERY, take Metformin as usual     . THE MORNING OF SURGERY, DO NOT take Metformin.  DO NOT TAKE ANY DIABETIC MEDICATIONS DAY OF YOUR SURGERY                               You may not have any metal on your body including hair pins and              piercings  Do not wear jewelry, make-up, lotions, powders or perfumes, deodorant             Do not wear nail polish on your fingernails.  Do not shave  48 hours prior to surgery.         Do not bring valuables to the hospital. [place].  Contacts, dentures or bridgework may not be worn into surgery.  Leave suitcase in the car. After surgery it may be brought to your room.     Patients discharged the day of surgery will not be allowed to drive home. IF YOU ARE HAVING SURGERY AND GOING HOME THE SAME DAY, YOU MUST HAVE AN ADULT TO DRIVE YOU HOME AND BE WITH YOU FOR 24 HOURS. YOU MAY GO HOME BY TAXI OR UBER OR ORTHERWISE, BUT AN ADULT MUST ACCOMPANY YOU HOME AND STAY WITH YOU FOR 24 HOURS.  Name and phone number of your driver:  Special Instructions: N/A              Please read over the following fact sheets you were given: _____________________________________________________________________ [facility]- Preparing for Total Shoulder Arthroplasty    Before  surgery, you can play an important role. Because skin is not sterile, your skin needs to be as free of germs as possible. You can reduce the number of germs on your skin by using the following products. . Benzoyl Peroxide Gel o Reduces the number of germs present on the skin o Applied twice a day to shoulder area starting two days before surgery    ==================================================================  Please follow these instructions carefully:  BENZOYL PEROXIDE 5% GEL  Please do not use if you have an allergy to benzoyl peroxide.   If your skin becomes reddened/irritated stop using the benzoyl peroxide.  Starting two days before surgery, apply as follows: 1. Apply benzoyl peroxide in the morning and at night. Apply after taking a shower. If you are not taking a shower clean entire shoulder front, back, and side along with the armpit with a clean wet washcloth.  2. Place a quarter-sized dollop on your shoulder and rub in thoroughly, making sure to cover the front, back, and side of your shoulder, along with the armpit.   2 days before ____ AM   ____ PM              1 day before ____ AM   ____ PM  3. Do this twice a day for two days.  (Last application is the night before surgery, AFTER using the CHG soap as described below).  4. Do NOT apply benzoyl peroxide gel on the day of surgery.         [facility] - Preparing for Surgery Before surgery, you can play an important role.  Because skin is not sterile, your skin needs to be as free of germs as possible.  You can reduce the number of germs on your skin by washing with CHG (chlorahexidine gluconate) soap before surgery.  CHG is an antiseptic cleaner which kills germs and bonds with the skin to continue killing germs even after washing. Please DO NOT use if you have an allergy to CHG or antibacterial soaps.  If your skin becomes reddened/irritated stop using the CHG and inform your nurse when you arrive  at Short Stay. Do not shave (including legs and underarms) for at least 48 hours prior to the first CHG shower.  You may shave your face/neck. Please follow these instructions carefully:  1.  Shower with CHG Soap the night before surgery and the  morning of Surgery.  2.  If you choose to wash your hair, wash your hair first as usual with your  normal  shampoo.  3.  After you shampoo, rinse your hair and body thoroughly to remove the  shampoo.                           4.  Use CHG as you would any other liquid soap.  You can apply chg directly  to the skin and wash                       Gently with a scrungie or clean washcloth.  5.  Apply the CHG Soap to your body ONLY FROM THE NECK DOWN.   Do not use on face/ open                           Wound or open sores. Avoid contact with eyes, ears mouth and genitals (private parts).                       Wash face,  Genitals (private parts) with your normal soap.             6.  Wash thoroughly, paying special attention to the area where your surgery  will be performed.  7.  Thoroughly rinse your body with warm water from the neck down.  8.  DO NOT shower/wash with your normal soap after using and rinsing off  the CHG Soap.                9.  Pat yourself dry with a clean towel.            10.  Wear clean pajamas.            11.  Place clean sheets on your bed the night of your first shower and do not  sleep with pets. Day of Surgery : Do not apply any lotions/deodorants the morning of surgery.  Please wear clean clothes to the hospital/surgery center.  FAILURE TO FOLLOW THESE INSTRUCTIONS MAY RESULT IN THE CANCELLATION OF YOUR SURGERY PATIENT SIGNATURE_________________________________  NURSE SIGNATURE__________________________________  ________________________________________________________________________   Jasmine Matthews  An incentive spirometer is a tool that  can help keep your lungs clear and active. This tool measures how well you  are filling your lungs with each breath. Taking long deep breaths may help reverse or decrease the chance of developing breathing (pulmonary) problems (especially infection) following:  A long period of time when you are unable to move or be active. BEFORE THE PROCEDURE   If the spirometer includes an indicator to show your best effort, your nurse or respiratory therapist will set it to a desired goal.  If possible, sit up straight or lean slightly forward. Try not to slouch.  Hold the incentive spirometer in an upright position. INSTRUCTIONS FOR USE  1. Sit on the edge of your bed if possible, or sit up as far as you can in bed or on a chair. 2. Hold the incentive spirometer in an upright position. 3. Breathe out normally. 4. Place the mouthpiece in your mouth and seal your lips tightly around it. 5. Breathe in slowly and as deeply as possible, raising the piston or the ball toward the top of the column. 6. Hold your breath for 3-5 seconds or for as long as possible. Allow the piston or ball to fall to the bottom of the column. 7. Remove the mouthpiece from your mouth and breathe out normally. 8. Rest for a few seconds and repeat Steps 1 through 7 at least 10 times every 1-2 hours when you are awake. Take your time and take a few normal breaths between deep breaths. 9. The spirometer may include an indicator to show your best effort. Use the indicator as a goal to work toward during each repetition. 10. After each set of 10 deep breaths, practice coughing to be sure your lungs are clear. If you have an incision (the cut made at the time of surgery), support your incision when coughing by placing a pillow or rolled up towels firmly against it. Once you are able to get out of bed, walk around indoors and cough well. You may stop using the incentive spirometer when instructed by your caregiver.  RISKS AND COMPLICATIONS  Take your time so you do not get dizzy or light-headed.  If you are in  pain, you may need to take or ask for pain medication before doing incentive spirometry. It is harder to take a deep breath if you are having pain. AFTER USE  Rest and breathe slowly and easily.  It can be helpful to keep track of a log of your progress. Your caregiver can provide you with a simple table to help with this. If you are using the spirometer at home, follow these instructions: [place] IF:   You are having difficultly using the spirometer.  You have trouble using the spirometer as often as instructed.  Your pain medication is not giving enough relief while using the spirometer.  You develop fever of 100.5 F (38.1 C) or higher. SEEK IMMEDIATE MEDICAL CARE IF:   You cough up bloody sputum that had not been present before.  You develop fever of 102 F (38.9 C) or greater.  You develop worsening pain at or near the incision site. MAKE SURE YOU:   Understand these instructions.  Will watch your condition.  Will get help right away if you are not doing well or get worse. Document Released: 02/11/2007 Document Revised: 12/24/2011 Document Reviewed: 04/14/2007 [facility] Patient Information 2014 [place], [place].   ________________________________________________________________________

## 2020-07-18 ENCOUNTER — Other Ambulatory Visit: Payer: Self-pay

## 2020-07-18 ENCOUNTER — Ambulatory Visit (HOSPITAL_COMMUNITY)
Admission: RE | Admit: 2020-07-18 | Discharge: 2020-07-18 | Disposition: A | Discharge status: Home / Self Care | Payer: BC Managed Care – PPO | Source: Ambulatory Visit | Attending: Orthopedic Surgery | Admitting: Orthopedic Surgery

## 2020-07-18 ENCOUNTER — Other Ambulatory Visit (HOSPITAL_COMMUNITY)
Admission: RE | Admit: 2020-07-18 | Discharge: 2020-07-18 | Disposition: A | Discharge status: Home / Self Care | Payer: BC Managed Care – PPO | Source: Ambulatory Visit

## 2020-07-18 ENCOUNTER — Encounter (HOSPITAL_COMMUNITY): Payer: Self-pay

## 2020-07-18 ENCOUNTER — Encounter (HOSPITAL_COMMUNITY)
Admission: RE | Admit: 2020-07-18 | Discharge: 2020-07-18 | Disposition: A | Discharge status: Home / Self Care | Payer: BC Managed Care – PPO | Source: Ambulatory Visit | Attending: Orthopedic Surgery | Admitting: Orthopedic Surgery

## 2020-07-18 DIAGNOSIS — Z01811 Encounter for preprocedural respiratory examination: Secondary | ICD-10-CM | POA: Insufficient documentation

## 2020-07-18 DIAGNOSIS — Z01812 Encounter for preprocedural laboratory examination: Secondary | ICD-10-CM | POA: Insufficient documentation

## 2020-07-18 DIAGNOSIS — Z20822 Contact with and (suspected) exposure to covid-19: Secondary | ICD-10-CM | POA: Insufficient documentation

## 2020-07-18 DIAGNOSIS — J90 Pleural effusion, not elsewhere classified: Secondary | ICD-10-CM | POA: Diagnosis not present

## 2020-07-18 DIAGNOSIS — Z01818 Encounter for other preprocedural examination: Secondary | ICD-10-CM | POA: Diagnosis not present

## 2020-07-18 LAB — URINALYSIS, ROUTINE W REFLEX MICROSCOPIC
Bilirubin Urine: NEGATIVE
Glucose, UA: NEGATIVE mg/dL
Hgb urine dipstick: NEGATIVE
Ketones, ur: NEGATIVE mg/dL
Nitrite: NEGATIVE
Protein, ur: NEGATIVE mg/dL
Specific Gravity, Urine: 1.013 (ref 1.005–1.030)
WBC, UA: >50 WBC/hpf — ABNORMAL HIGH (ref 0–5)
pH: 5 (ref 5.0–8.0)

## 2020-07-18 LAB — CBC WITH DIFFERENTIAL/PLATELET
Abs Immature Granulocytes: 0.04 10*3/uL (ref 0.00–0.07)
Basophils Absolute: 0.1 10*3/uL (ref 0.0–0.1)
Basophils Relative: 1 %
Eosinophils Absolute: 0.2 10*3/uL (ref 0.0–0.5)
Eosinophils Relative: 3 %
HCT: 33.5 % — ABNORMAL LOW (ref 36.0–46.0)
Hemoglobin: 10.8 g/dL — ABNORMAL LOW (ref 12.0–15.0)
Immature Granulocytes: 1 %
Lymphocytes Relative: 25 %
Lymphs Abs: 1.3 10*3/uL (ref 0.7–4.0)
MCH: 30.9 pg (ref 26.0–34.0)
MCHC: 32.2 g/dL (ref 30.0–36.0)
MCV: 96 fL (ref 80.0–100.0)
Monocytes Absolute: 0.7 10*3/uL (ref 0.1–1.0)
Monocytes Relative: 13 %
Neutro Abs: 2.9 10*3/uL (ref 1.7–7.7)
Neutrophils Relative %: 57 %
Platelets: 356 10*3/uL (ref 150–400)
RBC: 3.49 MIL/uL — ABNORMAL LOW (ref 3.87–5.11)
RDW: 11.9 % (ref 11.5–15.5)
WBC: 5.2 10*3/uL (ref 4.0–10.5)
nRBC: 0 % (ref 0.0–0.2)

## 2020-07-18 LAB — COMPREHENSIVE METABOLIC PANEL
ALT: 23 U/L (ref 0–44)
AST: 20 U/L (ref 15–41)
Albumin: 3.6 g/dL (ref 3.5–5.0)
Alkaline Phosphatase: 59 U/L (ref 38–126)
Anion gap: 12 (ref 5–15)
BUN: 18 mg/dL (ref 6–20)
CO2: 30 mmol/L (ref 22–32)
Calcium: 9.3 mg/dL (ref 8.9–10.3)
Chloride: 97 mmol/L — ABNORMAL LOW (ref 98–111)
Creatinine, Ser: 0.86 mg/dL (ref 0.44–1.00)
GFR calc Af Amer: >60 mL/min (ref >60)
GFR calc non Af Amer: >60 mL/min (ref >60)
Glucose, Bld: 163 mg/dL — ABNORMAL HIGH (ref 70–99)
Potassium: 3.6 mmol/L (ref 3.5–5.1)
Sodium: 139 mmol/L (ref 135–145)
Total Bilirubin: 1.1 mg/dL (ref 0.3–1.2)
Total Protein: 7.5 g/dL (ref 6.5–8.1)

## 2020-07-18 LAB — APTT: aPTT: 31 seconds (ref 24–36)

## 2020-07-18 LAB — SURGICAL PCR SCREEN
MRSA, PCR: NEGATIVE
Staphylococcus aureus: NEGATIVE

## 2020-07-18 LAB — PROTIME-INR
INR: 1 (ref 0.8–1.2)
Prothrombin Time: 12.8 seconds (ref 11.4–15.2)

## 2020-07-18 LAB — GLUCOSE, CAPILLARY: Glucose-Capillary: 128 mg/dL — ABNORMAL HIGH (ref 70–99)

## 2020-07-18 LAB — SARS CORONAVIRUS 2 (TAT 6-24 HRS): SARS Coronavirus 2: NEGATIVE

## 2020-07-18 NOTE — Progress Notes (Signed)
COVID Vaccine Completed: Yes Date COVID Vaccine completed: 01/27/20 COVID vaccine manufacturer: Pfizer    PCP - Dr. Alysia Penna. LOV: 09/02/19 Cardiologist - no  Chest x-ray -  EKG -  Stress Test -  ECHO -  Cardiac Cath -  Pacemaker/ICD device last checked:  Sleep Study -  CPAP -   Fasting Blood Sugar -  100's Checks Blood Sugar 2 times a Week  Blood Thinner Instructions: Aspirin Instructions: Last Dose:  Anesthesia review: Pt's HR was between 115-118. BP: 113/80. She was asymptomatic,denies any discomfort.Pt. able to walk long distances without SOB or chest pain.  Patient denies shortness of breath, fever, cough and chest pain at PAT appointment   Patient verbalized understanding of instructions that were given to them at the PAT appointment. Patient was also instructed that they will need to review over the PAT instructions again at home before surgery.

## 2020-07-19 ENCOUNTER — Other Ambulatory Visit (HOSPITAL_COMMUNITY): Payer: Self-pay

## 2020-07-19 NOTE — Progress Notes (Signed)
Lab. Results: UA: Bacteria on urine: many.

## 2020-07-21 ENCOUNTER — Encounter (HOSPITAL_COMMUNITY): Payer: Self-pay | Admitting: Orthopedic Surgery

## 2020-07-21 ENCOUNTER — Ambulatory Visit (HOSPITAL_COMMUNITY): Payer: BC Managed Care – PPO | Admitting: Certified Registered Nurse Anesthetist

## 2020-07-21 ENCOUNTER — Encounter (HOSPITAL_COMMUNITY)
Admission: RE | Disposition: A | Discharge status: Home / Self Care | Payer: Self-pay | Source: Ambulatory Visit | Attending: Orthopedic Surgery

## 2020-07-21 ENCOUNTER — Ambulatory Visit (HOSPITAL_COMMUNITY)
Admission: RE | Admit: 2020-07-21 | Discharge: 2020-07-21 | Disposition: A | Discharge status: Home / Self Care | Payer: BC Managed Care – PPO | Source: Ambulatory Visit | Attending: Orthopedic Surgery | Admitting: Orthopedic Surgery

## 2020-07-21 ENCOUNTER — Ambulatory Visit (HOSPITAL_COMMUNITY): Payer: BC Managed Care – PPO | Admitting: Physician Assistant

## 2020-07-21 ENCOUNTER — Ambulatory Visit (HOSPITAL_COMMUNITY): Payer: BC Managed Care – PPO

## 2020-07-21 DIAGNOSIS — E119 Type 2 diabetes mellitus without complications: Secondary | ICD-10-CM | POA: Diagnosis not present

## 2020-07-21 DIAGNOSIS — Z79899 Other long term (current) drug therapy: Secondary | ICD-10-CM | POA: Insufficient documentation

## 2020-07-21 DIAGNOSIS — W19XXXA Unspecified fall, initial encounter: Secondary | ICD-10-CM | POA: Insufficient documentation

## 2020-07-21 DIAGNOSIS — Z471 Aftercare following joint replacement surgery: Secondary | ICD-10-CM | POA: Diagnosis not present

## 2020-07-21 DIAGNOSIS — S4292XA Fracture of left shoulder girdle, part unspecified, initial encounter for closed fracture: Secondary | ICD-10-CM | POA: Diagnosis not present

## 2020-07-21 DIAGNOSIS — I10 Essential (primary) hypertension: Secondary | ICD-10-CM | POA: Insufficient documentation

## 2020-07-21 DIAGNOSIS — Z7984 Long term (current) use of oral hypoglycemic drugs: Secondary | ICD-10-CM | POA: Insufficient documentation

## 2020-07-21 DIAGNOSIS — G8918 Other acute postprocedural pain: Secondary | ICD-10-CM | POA: Diagnosis not present

## 2020-07-21 DIAGNOSIS — S43005A Unspecified dislocation of left shoulder joint, initial encounter: Secondary | ICD-10-CM | POA: Diagnosis not present

## 2020-07-21 DIAGNOSIS — Z8249 Family history of ischemic heart disease and other diseases of the circulatory system: Secondary | ICD-10-CM | POA: Diagnosis not present

## 2020-07-21 DIAGNOSIS — Z96612 Presence of left artificial shoulder joint: Secondary | ICD-10-CM

## 2020-07-21 HISTORY — PX: REVERSE SHOULDER ARTHROPLASTY: SHX5054

## 2020-07-21 LAB — GLUCOSE, CAPILLARY
Glucose-Capillary: 133 mg/dL — ABNORMAL HIGH (ref 70–99)
Glucose-Capillary: 122 mg/dL — ABNORMAL HIGH (ref 70–99)

## 2020-07-21 LAB — TYPE AND SCREEN
ABO/RH(D): A POS
Antibody Screen: NEGATIVE

## 2020-07-21 LAB — ABO/RH: ABO/RH(D): A POS

## 2020-07-21 SURGERY — ARTHROPLASTY, SHOULDER, TOTAL, REVERSE
Anesthesia: Regional | Site: Shoulder | Laterality: Left

## 2020-07-21 MED ORDER — PROMETHAZINE HCL 25 MG/ML IJ SOLN: 6.2500 mg | INTRAMUSCULAR | Status: DC | PRN

## 2020-07-21 MED ORDER — WATER FOR IRRIGATION, STERILE IR SOLN
Status: DC | PRN
  Administered 2020-07-21: 1000 mL

## 2020-07-21 MED ORDER — LACTATED RINGERS IV SOLN: INTRAVENOUS | Status: DC

## 2020-07-21 MED ORDER — FENTANYL CITRATE (PF) 100 MCG/2ML IJ SOLN
INTRAMUSCULAR | Status: AC
  Filled 2020-07-21: qty 2

## 2020-07-21 MED ORDER — BUPIVACAINE HCL (PF) 0.5 % IJ SOLN
INTRAMUSCULAR | Status: DC | PRN
  Administered 2020-07-21: 20 mL via PERINEURAL

## 2020-07-21 MED ORDER — LIDOCAINE 2% (20 MG/ML) 5 ML SYRINGE
INTRAMUSCULAR | Status: DC | PRN
  Administered 2020-07-21: 60 mg via INTRAVENOUS

## 2020-07-21 MED ORDER — PROPOFOL 10 MG/ML IV BOLUS
INTRAVENOUS | Status: DC | PRN
  Administered 2020-07-21: 50 mg via INTRAVENOUS
  Administered 2020-07-21: 100 mg via INTRAVENOUS

## 2020-07-21 MED ORDER — OXYCODONE HCL 5 MG PO TABS: 5.0000 mg | ORAL_TABLET | Freq: Once | ORAL | Status: DC | PRN

## 2020-07-21 MED ORDER — FENTANYL CITRATE (PF) 100 MCG/2ML IJ SOLN: 25.0000 ug | INTRAMUSCULAR | Status: DC | PRN

## 2020-07-21 MED ORDER — ONDANSETRON HCL 4 MG/2ML IJ SOLN
INTRAMUSCULAR | Status: AC
  Filled 2020-07-21: qty 2

## 2020-07-21 MED ORDER — TIZANIDINE HCL 4 MG PO TABS: 4.0000 mg | ORAL_TABLET | Freq: Three times a day (TID) | ORAL | 1 refills | Status: DC | PRN

## 2020-07-21 MED ORDER — LIDOCAINE 2% (20 MG/ML) 5 ML SYRINGE
INTRAMUSCULAR | Status: AC
  Filled 2020-07-21: qty 5

## 2020-07-21 MED ORDER — OXYCODONE-ACETAMINOPHEN 5-325 MG PO TABS: ORAL_TABLET | ORAL | 0 refills | Status: DC

## 2020-07-21 MED ORDER — PHENYLEPHRINE HCL (PRESSORS) 10 MG/ML IV SOLN
INTRAVENOUS | Status: DC | PRN
  Administered 2020-07-21 (×2): 80 ug via INTRAVENOUS

## 2020-07-21 MED ORDER — ORAL CARE MOUTH RINSE: 15.0000 mL | Freq: Once | OROMUCOSAL | Status: AC

## 2020-07-21 MED ORDER — MIDAZOLAM HCL 2 MG/2ML IJ SOLN
INTRAMUSCULAR | Status: AC
  Filled 2020-07-21: qty 2

## 2020-07-21 MED ORDER — PHENYLEPHRINE HCL-NACL 20-0.9 MG/250ML-% IV SOLN
INTRAVENOUS | Status: DC | PRN
  Administered 2020-07-21: 50 ug/min via INTRAVENOUS

## 2020-07-21 MED ORDER — CHLORHEXIDINE GLUCONATE 0.12 % MT SOLN
15.0000 mL | Freq: Once | OROMUCOSAL | Status: AC
  Administered 2020-07-21: 15 mL via OROMUCOSAL

## 2020-07-21 MED ORDER — TRANEXAMIC ACID-NACL 1000-0.7 MG/100ML-% IV SOLN
1000.0000 mg | INTRAVENOUS | Status: AC
  Administered 2020-07-21: 1000 mg via INTRAVENOUS
  Filled 2020-07-21: qty 100

## 2020-07-21 MED ORDER — ROCURONIUM BROMIDE 10 MG/ML (PF) SYRINGE
PREFILLED_SYRINGE | INTRAVENOUS | Status: AC
  Filled 2020-07-21: qty 10

## 2020-07-21 MED ORDER — MIDAZOLAM HCL 2 MG/2ML IJ SOLN
1.0000 mg | INTRAMUSCULAR | Status: DC
  Administered 2020-07-21: 2 mg via INTRAVENOUS
  Filled 2020-07-21: qty 2

## 2020-07-21 MED ORDER — FENTANYL CITRATE (PF) 100 MCG/2ML IJ SOLN
50.0000 ug | INTRAMUSCULAR | Status: DC
  Administered 2020-07-21 (×3): 50 ug via INTRAVENOUS
  Filled 2020-07-21: qty 2

## 2020-07-21 MED ORDER — SODIUM CHLORIDE 0.9 % IR SOLN
Status: DC | PRN
  Administered 2020-07-21: 1000 mL

## 2020-07-21 MED ORDER — SUGAMMADEX SODIUM 200 MG/2ML IV SOLN
INTRAVENOUS | Status: DC | PRN
  Administered 2020-07-21: 200 mg via INTRAVENOUS

## 2020-07-21 MED ORDER — 0.9 % SODIUM CHLORIDE (POUR BTL) OPTIME
TOPICAL | Status: DC | PRN
  Administered 2020-07-21: 1000 mL

## 2020-07-21 MED ORDER — CEFAZOLIN SODIUM-DEXTROSE 2-4 GM/100ML-% IV SOLN
2.0000 g | INTRAVENOUS | Status: AC
  Administered 2020-07-21: 2 g via INTRAVENOUS
  Filled 2020-07-21: qty 100

## 2020-07-21 MED ORDER — LACTATED RINGERS IV BOLUS
500.0000 mL | Freq: Once | INTRAVENOUS | Status: AC
  Administered 2020-07-21: 500 mL via INTRAVENOUS

## 2020-07-21 MED ORDER — DEXAMETHASONE SODIUM PHOSPHATE 10 MG/ML IJ SOLN
INTRAMUSCULAR | Status: DC | PRN
  Administered 2020-07-21: 5 mg via INTRAVENOUS

## 2020-07-21 MED ORDER — ONDANSETRON HCL 4 MG/2ML IJ SOLN
INTRAMUSCULAR | Status: DC | PRN
  Administered 2020-07-21: 4 mg via INTRAVENOUS

## 2020-07-21 MED ORDER — DEXAMETHASONE SODIUM PHOSPHATE 10 MG/ML IJ SOLN
INTRAMUSCULAR | Status: AC
  Filled 2020-07-21: qty 1

## 2020-07-21 MED ORDER — ACETAMINOPHEN 500 MG PO TABS
1000.0000 mg | ORAL_TABLET | Freq: Once | ORAL | Status: AC
  Administered 2020-07-21: 1000 mg via ORAL
  Filled 2020-07-21: qty 2

## 2020-07-21 MED ORDER — OXYCODONE HCL 5 MG/5ML PO SOLN: 5.0000 mg | Freq: Once | ORAL | Status: DC | PRN

## 2020-07-21 MED ORDER — PROPOFOL 10 MG/ML IV BOLUS
INTRAVENOUS | Status: AC
  Filled 2020-07-21: qty 20

## 2020-07-21 MED ORDER — BUPIVACAINE LIPOSOME 1.3 % IJ SUSP
INTRAMUSCULAR | Status: DC | PRN
  Administered 2020-07-21: 10 mL

## 2020-07-21 MED ORDER — ROCURONIUM BROMIDE 100 MG/10ML IV SOLN
INTRAVENOUS | Status: DC | PRN
  Administered 2020-07-21: 50 mg via INTRAVENOUS

## 2020-07-21 MED ORDER — AMISULPRIDE (ANTIEMETIC) 5 MG/2ML IV SOLN: 10.0000 mg | Freq: Once | INTRAVENOUS | Status: DC | PRN

## 2020-07-21 SURGICAL SUPPLY — 78 items
AID PSTN UNV HD RSTRNT DISP (MISCELLANEOUS)
BAG SPEC THK2 15X12 ZIP CLS (MISCELLANEOUS) ×1
BAG ZIPLOCK 12X15 (MISCELLANEOUS) ×2 IMPLANT
BASEPLATE GLENOSPHERE 25 (Plate) ×1 IMPLANT
BEARING HUMERAL 40 STD VITE (Joint) ×1 IMPLANT
BIT DRILL 1.6MX128 (BIT) IMPLANT
BIT DRILL TWIST 2.7 (BIT) ×1 IMPLANT
BLADE SAW SAG 73X25 THK (BLADE) ×1
BLADE SAW SGTL 73X25 THK (BLADE) ×1 IMPLANT
BOOTIES KNEE HIGH SLOAN (MISCELLANEOUS) ×4 IMPLANT
BRNG HUM STD 40 RVRS SHLDR (Joint) ×1 IMPLANT
CLSR STERI-STRIP ANTIMIC 1/2X4 (GAUZE/BANDAGES/DRESSINGS) ×1 IMPLANT
COOLER ICEMAN CLASSIC (MISCELLANEOUS) IMPLANT
COVER BACK TABLE 60X90IN (DRAPES) ×2 IMPLANT
COVER SURGICAL LIGHT HANDLE (MISCELLANEOUS) ×2 IMPLANT
COVER WAND RF STERILE (DRAPES) IMPLANT
DIAL VERSA SHOULDER 40 STD (Joint) ×1 IMPLANT
DRAPE INCISE IOBAN 66X45 STRL (DRAPES) ×2 IMPLANT
DRAPE ORTHO SPLIT 77X108 STRL (DRAPES) ×4
DRAPE POUCH INSTRU U-SHP 10X18 (DRAPES) ×2 IMPLANT
DRAPE SHEET LG 3/4 BI-LAMINATE (DRAPES) ×2 IMPLANT
DRAPE SURG 17X11 SM STRL (DRAPES) ×2 IMPLANT
DRAPE SURG ORHT 6 SPLT 77X108 (DRAPES) ×2 IMPLANT
DRAPE TOP 10253 STERILE (DRAPES) ×2 IMPLANT
DRAPE U-SHAPE 47X51 STRL (DRAPES) ×2 IMPLANT
DRSG AQUACEL AG ADV 3.5X 4 (GAUZE/BANDAGES/DRESSINGS) ×1 IMPLANT
DRSG AQUACEL AG ADV 3.5X 6 (GAUZE/BANDAGES/DRESSINGS) ×2 IMPLANT
DURAPREP 26ML APPLICATOR (WOUND CARE) ×2 IMPLANT
ELECT BLADE TIP CTD 4 INCH (ELECTRODE) ×2 IMPLANT
ELECT REM PT RETURN 15FT ADLT (MISCELLANEOUS) ×2 IMPLANT
GLOVE BIO SURGEON STRL SZ7 (GLOVE) ×2 IMPLANT
GLOVE BIO SURGEON STRL SZ7.5 (GLOVE) ×2 IMPLANT
GLOVE BIOGEL PI IND STRL 7.0 (GLOVE) ×1 IMPLANT
GLOVE BIOGEL PI IND STRL 8 (GLOVE) ×1 IMPLANT
GLOVE BIOGEL PI INDICATOR 7.0 (GLOVE) ×1
GLOVE BIOGEL PI INDICATOR 8 (GLOVE) ×1
GOWN STRL REUS W/TWL LRG LVL3 (GOWN DISPOSABLE) ×2 IMPLANT
GOWN STRL REUS W/TWL XL LVL3 (GOWN DISPOSABLE) ×2 IMPLANT
HANDPIECE INTERPULSE COAX TIP (DISPOSABLE) ×2
HOOD PEEL AWAY FLYTE STAYCOOL (MISCELLANEOUS) ×6 IMPLANT
KIT BASIN OR (CUSTOM PROCEDURE TRAY) ×2 IMPLANT
KIT TURNOVER KIT A (KITS) IMPLANT
MANIFOLD NEPTUNE II (INSTRUMENTS) ×2 IMPLANT
NDL TROCAR POINT SZ 2 1/2 (NEEDLE) IMPLANT
NEEDLE TROCAR POINT SZ 2 1/2 (NEEDLE) IMPLANT
NS IRRIG 1000ML POUR BTL (IV SOLUTION) ×2 IMPLANT
PACK SHOULDER (CUSTOM PROCEDURE TRAY) ×2 IMPLANT
PAD COLD SHLDR WRAP-ON (PAD) IMPLANT
PIN THREADED REVERSE (PIN) ×1 IMPLANT
PROTECTOR NERVE ULNAR (MISCELLANEOUS) IMPLANT
RESTRAINT HEAD UNIVERSAL NS (MISCELLANEOUS) IMPLANT
RETRIEVER SUT HEWSON (MISCELLANEOUS) IMPLANT
SCREW BONE LOCKING 4.75X35X3.5 (Screw) ×2 IMPLANT
SCREW BONE STRL 6.5MMX30MM (Screw) ×1 IMPLANT
SCREW LOCKING 4.75MMX15MM (Screw) ×1 IMPLANT
SCREW LOCKING NS 4.75MMX20MM (Screw) ×1 IMPLANT
SET HNDPC FAN SPRY TIP SCT (DISPOSABLE) ×1 IMPLANT
SLING ARM IMMOBILIZER LRG (SOFTGOODS) ×1 IMPLANT
SLING ARM IMMOBILIZER MED (SOFTGOODS) IMPLANT
SPONGE LAP 18X18 RF (DISPOSABLE) ×2 IMPLANT
STEM HUMERAL STRL 11MMX83MM (Stem) ×1 IMPLANT
STRIP CLOSURE SKIN 1/2X4 (GAUZE/BANDAGES/DRESSINGS) ×4 IMPLANT
SUCTION FRAZIER HANDLE 10FR (MISCELLANEOUS)
SUCTION TUBE FRAZIER 10FR DISP (MISCELLANEOUS) IMPLANT
SUPPORT WRAP ARM LG (MISCELLANEOUS) IMPLANT
SUT ETHIBOND 2 V 37 (SUTURE) IMPLANT
SUT FIBERWIRE #2 38 REV NDL BL (SUTURE)
SUT MNCRL AB 4-0 PS2 18 (SUTURE) ×2 IMPLANT
SUT VIC AB 2-0 CT1 27 (SUTURE) ×2
SUT VIC AB 2-0 CT1 TAPERPNT 27 (SUTURE) ×1 IMPLANT
SUTURE FIBERWR#2 38 REV NDL BL (SUTURE) IMPLANT
TAPE LABRALWHITE 1.5X36 (TAPE) IMPLANT
TAPE SUT LABRALTAP WHT/BLK (SUTURE) IMPLANT
TOWEL OR 17X26 10 PK STRL BLUE (TOWEL DISPOSABLE) ×2 IMPLANT
TOWEL OR NON WOVEN STRL DISP B (DISPOSABLE) ×2 IMPLANT
TRAY HUM MINI SHOULDER +0 40D (Shoulder) ×1 IMPLANT
WATER STERILE IRR 1000ML POUR (IV SOLUTION) ×2 IMPLANT
YANKAUER SUCT BULB TIP 10FT TU (MISCELLANEOUS) ×2 IMPLANT

## 2020-07-21 NOTE — Discharge Instructions (Signed)

## 2020-07-21 NOTE — Transfer of Care (Signed)
Immediate Anesthesia Transfer of Care Note  Patient: Jasmine Matthews  Procedure(s) Performed: REVERSE SHOULDER ARTHROPLASTY (Left Shoulder)  Patient Location: PACU  Anesthesia Type:GA combined with regional for post-op pain  Level of Consciousness: awake, alert , oriented and patient cooperative  Airway & Oxygen Therapy: Patient Spontanous Breathing and Patient connected to face mask oxygen  Post-op Assessment: Report given to RN and Post -op Vital signs reviewed and stable  Post vital signs: Reviewed and stable  Last Vitals:  Vitals Value Taken Time  BP 137/72 07/21/20 1123  Temp    Pulse 86 07/21/20 1124  Resp 22 07/21/20 1125  SpO2 98 % 07/21/20 1124  Vitals shown include unvalidated device data.  Last Pain:  Vitals:   07/21/20 0756  TempSrc: Oral  PainSc:          Complications: No complications documented.

## 2020-07-21 NOTE — Op Note (Signed)
Procedure(s): REVERSE SHOULDER ARTHROPLASTY Procedure Note  BREONNA GAFFORD female 54 y.o. 07/21/2020   Preoperative diagnosis: Left shoulder fracture dislocation  Postoperative diagnosis: Same  Procedure(s) and Anesthesia Type:    Left shoulder REVERSE SHOULDER ARTHROPLASTY - Choice   Indications:  54 y.o. female  With left shoulder fracture dislocation that occurred after a fall in the bathroom. There were x-ray findings concerning for possible chronicity of the injury with softening of the cortical edges and appearance of some early callus formation, but the patient was insistent that this injury just happened within the last week. She was indicated for surgical treatment to try and restore function to the shoulder. Given the comminuted and displaced fracture dislocation reconstruction was not possible and for the best possible outcome I felt that reverse total shoulder arthroplasty was indicated. She understood potential risks benefits and alternatives to the procedure and wished to proceed.      Surgeon: Isabella Stalling   Assistants: Jeanmarie Hubert PA-C [facility] was present and scrubbed throughout the procedure and was essential in positioning, retraction, exposure, and closure)  Anesthesia: General endotracheal anesthesia with preoperative interscalene block given by the attending anesthesiologist    Procedure Detail  REVERSE SHOULDER ARTHROPLASTY   Estimated Blood Loss:  200 mL         Drains: none  Blood Given: none          Specimens: none        Complications:  * No complications entered in OR log *         Disposition: PACU - hemodynamically stable.         Condition: stable      OPERATIVE FINDINGS:  A Biomet reverse shoulder replacement was placed with a mini stem, press-fit. She had a 40 glenosphere with good stability. The tuberosities were very scarred inand comminuted. Tuberosity reconstruction was not possible and the tuberosities were  resected.Marland Kitchen   PROCEDURE: The patient was identified in the preoperative holding area  where I personally marked the operative site after verifying site, side,  and procedure with the patient. An interscalene block given by  the attending anesthesiologist in the holding area and the patient was taken back to the operating room where all extremities were  carefully padded in position after general anesthesia was induced. She  was placed in a beach-chair position and the operative upper extremity was  prepped and draped in a standard sterile fashion. An approximately 10-  cm incision was made from the tip of the coracoid process to the center  point of the humerus at the level of the axilla. Dissection was carried  down through subcutaneous tissues to the level of the cephalic vein  which was taken laterally with the deltoid. The pectoralis major was  retracted medially. The subdeltoid space was developed and the lateral  edge of the conjoined tendon was identified. There was significant scar tissue throughout and meticulous careful dissection was used to recreate the surgical planes. The undersurface of  conjoined tendon was palpated and the musculocutaneous nerve was not in  the field. Retractor was placed underneath the conjoined and second  retractor was placed lateral into the deltoid.  Digital palpation was used to identify the humeral head in the axilla. I was able to remove it with a grasper without significant difficulty. I was not able to reliably palpate the axillary nerve within the scar tissue. The proximal tuberosities were examined. There were noted to be very scarred in and after attempted mobilization  there was really no mobility to the tuberosities and they were not in a good position to allow reconstruction around the implant. I felt that these would only cause trouble with impingement and resected the tuberosities.  At this point the glenoid was exposed and the labrum was  removed circumferentially. The capsule was released. The guidepin was then placed and the reamer was used centrally. The glenoid baseplate was impacted with good press-fit and the central screw was then measured in place. The peripheral screws were all in place with locking screws.  Attention was then turned back to the humerus where the humeral canal was prepped. The size 11 stem was felt to be appropriate. The broach was placed. Attention was then turned back to the glenoid where a 40 glenosphere trial was placed and the joint was reduced with a standard polyethylene component. Stability was excellent and there was no impingement.  The joint was then again dislocated and attention was turned back to the glenoid where the size 40 glenosphere was impacted with good fit. At this point the humerus was again exposed and the final implant was placed with a size 11 short stem with press-fit. The standard polyethylene component was used and the stability and motion were excellent without any impingement or tendency towards laxity or dislocation.  At this point copious irrigation was used. No tuberosity repair was possible. Again by digital palpation attempted to find the axillary nerve. There was a few structures medially that could represent the axillary nerve but was very difficult to reliably determine its location given the amount of scar tissue in this region.   Skin was closed with 2-0 Vicryl in a deep dermal layer and 4-0  Monocryl for skin closure. Steri-Strips were applied. Sterile  dressings were then applied as well as a sling. The patient was allowed  to awaken from general anesthesia, transferred to stretcher, and taken  to recovery room in stable condition.   POSTOPERATIVE PLAN: The patient will be observed in the recovery room. If she has good pain control with her regional block and she is hemodynamically stable she will be discharged home today with family. She'll follow up with me in about  2 weeks.

## 2020-07-21 NOTE — Anesthesia Procedure Notes (Signed)
Anesthesia Regional Block: Interscalene brachial plexus block   Pre-Anesthetic Checklist: ,, timeout performed, Correct Patient, Correct Site, Correct Laterality, Correct Procedure, Correct Position, site marked, Risks and benefits discussed,  Surgical consent,  Pre-op evaluation,  At surgeon's request and post-op pain management  Laterality: Left  Prep: chloraprep       Needles:  Injection technique: Single-shot  Needle Type: Echogenic Stimulator Needle     Needle Length: 10cm  Needle Gauge: 21     Additional Needles:   Procedures:,,,, ultrasound used (permanent image in chart),,,,  Narrative:  Start time: 07/21/2020 8:00 AM End time: 07/21/2020 8:05 AM Injection made incrementally with aspirations every 5 mL.  Performed by: Personally  Anesthesiologist: Merlinda Frederick, MD  Additional Notes: Functioning IV was confirmed and monitors applied. Sterile prep and drape,hand hygiene and sterile gloves were used.Ultrasound guidance: relevant anatomy identified, needle position confirmed, local anesthetic spread visualized around nerve(s)., vascular puncture avoided.  Image printed for medical record.  Negative aspiration and negative test dose prior to incremental administration of local anesthetic. The patient tolerated the procedure well.

## 2020-07-21 NOTE — Anesthesia Preprocedure Evaluation (Addendum)
Anesthesia Evaluation  Patient identified by MRN, date of birth, ID band Patient awake    Reviewed: Allergy & Precautions, NPO status , Patient's Chart, lab work & pertinent test results  Airway Mallampati: II  TM Distance: >3 FB Neck ROM: Full    Dental no notable dental hx.    Pulmonary neg pulmonary ROS,    breath sounds clear to auscultation       Cardiovascular Exercise Tolerance: Good hypertension, Normal cardiovascular exam Rhythm:Regular Rate:Normal     Neuro/Psych negative neurological ROS  negative psych ROS   GI/Hepatic negative GI ROS, Neg liver ROS,   Endo/Other  diabetes, Type 2, Oral Hypoglycemic AgentsBMI 34  Renal/GU negative Renal ROS     Musculoskeletal negative musculoskeletal ROS (+)   Abdominal   Peds negative pediatric ROS (+)  Hematology  (+) anemia ,   Anesthesia Other Findings Shoulder fracture  Reproductive/Obstetrics negative OB ROS                            Anesthesia Physical Anesthesia Plan  ASA: II  Anesthesia Plan: General and Regional   Post-op Pain Management: GA combined w/ Regional for post-op pain   Induction: Intravenous  PONV Risk Score and Plan: 3 and Midazolam, Dexamethasone and Ondansetron  Airway Management Planned: Oral ETT  Additional Equipment:   Intra-op Plan:   Post-operative Plan: Extubation in OR  Informed Consent: I have reviewed the patients History and Physical, chart, labs and discussed the procedure including the risks, benefits and alternatives for the proposed anesthesia with the patient or authorized representative who has indicated his/her understanding and acceptance.     Dental advisory given  Plan Discussed with: CRNA and Anesthesiologist  Anesthesia Plan Comments: (Interscalene block with exparel. GETA. )       Anesthesia Quick Evaluation

## 2020-07-21 NOTE — Anesthesia Procedure Notes (Signed)
Procedure Name: Intubation Date/Time: 07/21/2020 9:42 AM Performed by: West Pugh, CRNA Pre-anesthesia Checklist: Patient identified Patient Re-evaluated:Patient Re-evaluated prior to induction Oxygen Delivery Method: Circle system utilized Preoxygenation: Pre-oxygenation with 100% oxygen Induction Type: IV induction Ventilation: Mask ventilation without difficulty Laryngoscope Size: Miller and 2 Grade View: Grade II Tube type: Oral Number of attempts: 1 Airway Equipment and Method: Stylet Placement Confirmation: ETT inserted through vocal cords under direct vision,  positive ETCO2 and breath sounds checked- equal and bilateral Secured at: 22 cm Tube secured with: Tape Dental Injury: Teeth and Oropharynx as per pre-operative assessment  Comments: perfomed by Jacqualine Code

## 2020-07-21 NOTE — H&P (Signed)
Jasmine Matthews is an 54 y.o. female.   Chief Complaint: L shoulder injury HPI: s/p fall with L shoulder fracture dislocation.  Past Medical History:  Diagnosis Date  . Abnormal Pap smear of cervix 12/28/15    ASCUS. HR [id number]  . Allergy   . Diabetes mellitus   . Hypertension     Past Surgical History:  Procedure Laterality Date  . COLONOSCOPY  10/02/2018   per Dr. Silverio Decamp, adenmatous polyps, repeat in 5 yrs   . FOOT SURGERY  10-17-09   per Dr. Berenice Primas, removed a fractured sesamoid bone     Family History  Problem Relation Age of Onset  . Hypertension Father   . Hypertension Mother   . Colon cancer Neg Hx   . Esophageal cancer Neg Hx   . Rectal cancer Neg Hx   . Stomach cancer Neg Hx   . Breast cancer Neg Hx    Social History:  reports that she has never smoked. She has never used smokeless tobacco. She reports current alcohol use of about 3.0 standard drinks of alcohol per week. She reports that she does not use drugs.  Allergies: No Known Allergies  Medications Prior to Admission  Medication Sig Dispense Refill  . amLODipine (NORVASC) 10 MG tablet TAKE 1 TABLET BY MOUTH EVERY DAY 90 tablet 3  . HYDROcodone-acetaminophen (NORCO/VICODIN) 5-325 MG tablet Take 1 tablet by mouth 3 (three) times daily as needed for moderate pain.     Marland Kitchen lisinopril-hydrochlorothiazide (ZESTORETIC) 20-12.5 MG tablet TAKE 1 TABLET BY MOUTH TWICE A DAY (Patient taking differently: Take 1 tablet by mouth 2 (two) times daily. ) 180 tablet 3  . metFORMIN (GLUCOPHAGE) 500 MG tablet TAKE 1 TABLET (500 MG TOTAL) BY MOUTH 2 (TWO) TIMES DAILY WITH A MEAL. 180 tablet 3  . atorvastatin (LIPITOR) 20 MG tablet Take 1 tablet (20 mg total) by mouth daily. (Patient not taking: Reported on 07/14/2020) 90 tablet 3    Results for orders placed or performed during the hospital encounter of 07/21/20 (from the past 48 hour(s))  ABO/Rh     Status: None   Collection Time: 07/21/20  7:45 AM  Result Value Ref Range    ABO/RH(D)      A POS Performed at [facility], [place] [address], [place], [place] [address]    No results found.  Review of Systems  All other systems reviewed and are negative.   Blood pressure 117/80, pulse 85, temperature 99.6 F (37.6 C), temperature source Oral, resp. rate 18, height 5\' 7"  (1.702 m), weight 99.3 kg, last menstrual period 09/08/2018, SpO2 100 %. Physical Exam HENT:     Head: Atraumatic.  Eyes:     Extraocular Movements: Extraocular movements intact.  Cardiovascular:     Pulses: Normal pulses.  Pulmonary:     Effort: Pulmonary effort is normal.  Musculoskeletal:     Comments: L shoulder pain with limited motion. NVID.  Skin:    General: Skin is warm and dry.  Neurological:     Mental Status: She is alert.  Psychiatric:        Mood and Affect: Mood normal.      Assessment/Plan s/p fall with L shoulder fracture dislocation. Plan L reverse TSA. Risks / benefits of surgery discussed Consent on chart  NPO for OR Preop antibiotics  Isabella Stalling, MD 07/21/2020, 8:55 AM

## 2020-07-21 NOTE — Progress Notes (Signed)
Occupational Therapy Evaluation Patient Details Name: Jasmine Matthews MRN: [id number] DOB: [birth year] Today's Date: 07/21/2020    History of Present Illness Patient is a 54 year old female admitted for L reverse total shoulder arthroplasty. PMH includes foot surgery, DM   Clinical Impression   s/p shoulder replacement without functional use of left non dominant upper extremity secondary to effects of surgery and interscalene block and shoulder precautions. Therapist provided education and instruction to patient in regards to exercises, precautions, positioning, donning upper extremity clothing and bathing while maintaining shoulder precautions, ice and edema management and donning/doffing sling. Patient verbalized understanding and demonstrated as needed. Patient needed assistance to donn shirt, underwear, pants, socks and shoes and provided with instruction on compensatory strategies to perform ADLs. Patient to follow up with MD for further therapy needs.      Follow Up Recommendations  Follow surgeon's recommendation for DC plan and follow-up therapies    Equipment Recommendations  None recommended by OT       Precautions / Restrictions Precautions Precautions: Shoulder Type of Shoulder Precautions: AROM elbow, wrist, hand ok Shoulder Interventions: Shoulder sling/immobilizer;Off for dressing/bathing/exercises Precaution Booklet Issued: Yes (comment) Required Braces or Orthoses: Sling Restrictions Weight Bearing Restrictions: Yes LUE Weight Bearing: Non weight bearing      Mobility Bed Mobility               General bed mobility comments: in chair  Transfers Overall transfer level: Modified independent                    Balance Overall balance assessment: No apparent balance deficits (not formally assessed)                                         ADL either performed or assessed with clinical judgement   ADL Overall ADL's : Needs  assistance/impaired Eating/Feeding: Independent;Sitting Eating/Feeding Details (indicate cue type and reason): drink from cup Grooming: Modified independent   Upper Body Bathing: Minimal assistance;Sitting   Lower Body Bathing: Supervison/ safety;Sit to/from stand   Upper Body Dressing : Minimal assistance;Adhering to UE precautions;Cueing for sequencing;Cueing for compensatory techniques;Cueing for UE precautions   Lower Body Dressing: Supervision/safety;Sit to/from stand   Toilet Transfer: Ambulation;Modified Independent   Toileting- Water quality scientist and Hygiene: Supervision/safety;Sit to/from stand       Functional mobility during ADLs: Modified independent General ADL Comments: educated patient in compensatory strategies to implement during ADLs to adhere to shoulder precautions                  Pertinent Vitals/Pain Pain Assessment: Faces Faces Pain Scale: Hurts a little bit Pain Location: L UE Pain Descriptors / Indicators: Heaviness;Numbness Pain Intervention(s): Monitored during session     Hand Dominance Right   Extremity/Trunk Assessment Upper Extremity Assessment Upper Extremity Assessment: LUE deficits/detail LUE Deficits / Details: + nerve block   Lower Extremity Assessment Lower Extremity Assessment: Overall WFL for tasks assessed       Communication Communication Communication: No difficulties   Cognition Arousal/Alertness: Awake/alert Behavior During Therapy: WFL for tasks assessed/performed Overall Cognitive Status: Within Functional Limits for tasks assessed                                        Exercises Exercises: Shoulder   Shoulder Instructions  Shoulder Instructions Donning/doffing shirt without moving shoulder: Minimal assistance;Patient able to independently direct caregiver Method for sponge bathing under operated UE: Minimal assistance;Patient able to independently direct caregiver Donning/doffing  sling/immobilizer: Moderate assistance;Patient able to independently direct caregiver Correct positioning of sling/immobilizer: Independent;Patient able to independently direct caregiver Pendulum exercises (written home exercise program):  (N/A) ROM for elbow, wrist and digits of operated UE: Patient able to independently direct caregiver Sling wearing schedule (on at all times/off for ADL's): Independent;Patient able to independently direct caregiver Proper positioning of operated UE when showering: Patient able to independently direct caregiver Positioning of UE while sleeping: Patient able to independently direct caregiver    Home Living Family/patient expects to be discharged to:: Private residence Living Arrangements: Alone Available Help at Discharge: Family;Available 24 hours/day Type of Home: House Home Access: Stairs to enter [employer] of Steps: 3   Home Layout: One level     Bathroom Shower/Tub: Teacher, early years/pre: Standard     Home Equipment: None   Additional Comments: plans to stay with sister, info is for sister's house      Prior Functioning/Environment Level of Independence: Independent                 OT Problem List: Impaired UE functional use;Pain         OT Goals(Current goals can be found in the care plan section) Acute Rehab OT Goals Patient Stated Goal: go home OT Goal Formulation: With patient   AM-PAC OT "6 Clicks" Daily Activity     Outcome Measure Help from another person eating meals?: None Help from another person taking care of personal grooming?: A Little Help from another person toileting, which includes using toliet, bedpan, or urinal?: A Little Help from another person bathing (including washing, rinsing, drying)?: A Little Help from another person to put on and taking off regular upper body clothing?: A Little Help from another person to put on and taking off regular lower body clothing?: A Little 6  Click Score: 19   End of Session Equipment Utilized During Treatment: Other (comment) (sling) Nurse Communication: Mobility status  Activity Tolerance: Patient tolerated treatment well Patient left: in chair;with call bell/phone within reach;with nursing/sitter in room  OT Visit Diagnosis: Pain Pain - Right/Left: Left Pain - part of body: Shoulder                Time: 1240-1305 OT Time Calculation (min): 25 min Charges:  OT General Charges $OT Visit: 1 Visit OT Evaluation $OT Eval Low Complexity: 1 Low OT Treatments $Self Care/Home Management : 8-22 mins  Delbert Phenix OT OT pager: [place] 07/21/2020, 1:28 PM

## 2020-07-21 NOTE — Progress Notes (Signed)
Assisted Dr. Bass with left, ultrasound guided, interscalene  block. Side rails up, monitors on throughout procedure. See vital signs in flow sheet. Tolerated Procedure well.  

## 2020-07-21 NOTE — Anesthesia Postprocedure Evaluation (Signed)
Anesthesia Post Note  Patient: Jasmine Matthews  Procedure(s) Performed: REVERSE SHOULDER ARTHROPLASTY (Left Shoulder)     Patient location during evaluation: PACU Anesthesia Type: Regional and General Level of consciousness: sedated Pain management: pain level controlled Vital Signs Assessment: post-procedure vital signs reviewed and stable Respiratory status: spontaneous breathing and respiratory function stable Cardiovascular status: stable Postop Assessment: no apparent nausea or vomiting Anesthetic complications: no   No complications documented.  Last Vitals:  Vitals:   07/21/20 1200 07/21/20 1216  BP: 133/87 134/84  Pulse: 85 90  Resp: 18   Temp: 36.7 C   SpO2: 94% 94%    Last Pain:  Vitals:   07/21/20 1216  TempSrc:   PainSc: 2                  Merlinda Frederick

## 2020-07-22 ENCOUNTER — Encounter (HOSPITAL_COMMUNITY): Payer: Self-pay | Admitting: Orthopedic Surgery

## 2020-08-03 DIAGNOSIS — Z96612 Presence of left artificial shoulder joint: Secondary | ICD-10-CM | POA: Diagnosis not present

## 2020-08-03 DIAGNOSIS — Z471 Aftercare following joint replacement surgery: Secondary | ICD-10-CM | POA: Diagnosis not present

## 2020-09-02 ENCOUNTER — Other Ambulatory Visit: Payer: Self-pay

## 2020-09-02 ENCOUNTER — Encounter: Payer: Self-pay | Admitting: Family Medicine

## 2020-09-02 ENCOUNTER — Ambulatory Visit (INDEPENDENT_AMBULATORY_CARE_PROVIDER_SITE_OTHER): Payer: BC Managed Care – PPO | Admitting: Family Medicine

## 2020-09-02 VITALS — BP 128/70 | HR 96 | Temp 99.2°F | Ht 67.0 in | Wt 222.8 lb

## 2020-09-02 DIAGNOSIS — E119 Type 2 diabetes mellitus without complications: Secondary | ICD-10-CM

## 2020-09-02 DIAGNOSIS — Z0000 Encounter for general adult medical examination without abnormal findings: Secondary | ICD-10-CM

## 2020-09-02 MED ORDER — METFORMIN HCL 500 MG PO TABS
500.0000 mg | ORAL_TABLET | Freq: Two times a day (BID) | ORAL | 3 refills | Status: DC
Start: 2020-09-02 — End: 2022-05-03

## 2020-09-02 MED ORDER — AMLODIPINE BESYLATE 10 MG PO TABS
10.0000 mg | ORAL_TABLET | Freq: Every day | ORAL | 3 refills | Status: DC
Start: 2020-09-02 — End: 2022-05-03

## 2020-09-02 MED ORDER — LISINOPRIL-HYDROCHLOROTHIAZIDE 20-12.5 MG PO TABS
1.0000 | ORAL_TABLET | Freq: Two times a day (BID) | ORAL | 3 refills | Status: DC
Start: 2020-09-02 — End: 2022-05-03

## 2020-09-02 NOTE — Progress Notes (Signed)
   Subjective:    Patient ID: Jasmine Matthews, female    DOB: November 30, 1965, 54 y.o.   MRN: [id number]  HPI Here for a well exam. She feels great. She had a left shoulder total arthroplasty in October, and this went very well. Her rehab is right on track. She has lost a little weight.    Review of Systems  Constitutional: Negative.   HENT: Negative.   Eyes: Negative.   Respiratory: Negative.   Cardiovascular: Negative.   Gastrointestinal: Negative.   Genitourinary: Negative for decreased urine volume, difficulty urinating, dyspareunia, dysuria, enuresis, flank pain, frequency, hematuria, pelvic pain and urgency.  Musculoskeletal: Negative.   Skin: Negative.   Neurological: Negative.   Psychiatric/Behavioral: Negative.        Objective:   Physical Exam Constitutional:      General: She is not in acute distress.    Appearance: She is well-developed.  HENT:     Head: Normocephalic and atraumatic.     Right Ear: External ear normal.     Left Ear: External ear normal.     Nose: Nose normal.     Mouth/Throat:     Pharynx: No oropharyngeal exudate.  Eyes:     General: No scleral icterus.    Conjunctiva/sclera: Conjunctivae normal.     Pupils: Pupils are equal, round, and reactive to light.  Neck:     Thyroid: No thyromegaly.     Vascular: No JVD.  Cardiovascular:     Rate and Rhythm: Normal rate and regular rhythm.     Heart sounds: Normal heart sounds. No murmur heard.  No friction rub. No gallop.   Pulmonary:     Effort: Pulmonary effort is normal. No respiratory distress.     Breath sounds: Normal breath sounds. No wheezing or rales.  Chest:     Chest wall: No tenderness.  Abdominal:     General: Bowel sounds are normal. There is no distension.     Palpations: Abdomen is soft. There is no mass.     Tenderness: There is no abdominal tenderness. There is no guarding or rebound.  Musculoskeletal:        General: No tenderness. Normal range of motion.     Cervical back:  Normal range of motion and neck supple.  Lymphadenopathy:     Cervical: No cervical adenopathy.  Skin:    General: Skin is warm and dry.     Findings: No erythema or rash.  Neurological:     Mental Status: She is alert and oriented to person, place, and time.     Cranial Nerves: No cranial nerve deficit.     Motor: No abnormal muscle tone.     Coordination: Coordination normal.     Deep Tendon Reflexes: Reflexes are normal and symmetric. Reflexes normal.  Psychiatric:        Behavior: Behavior normal.        Thought Content: Thought content normal.        Judgment: Judgment normal.           Assessment & Plan:  Well exam. We discussed diet and exercise. Get fasting labs.  Alysia Penna, MD

## 2020-09-02 NOTE — Addendum Note (Signed)
Addended by: Marrion Coy on: 09/02/2020 12:33 PM   Modules accepted: Orders

## 2020-09-03 LAB — HEPATIC FUNCTION PANEL
AG Ratio: 1.2 (calc) (ref 1.0–2.5)
ALT: 13 U/L (ref 6–29)
AST: 17 U/L (ref 10–35)
Albumin: 4.3 g/dL (ref 3.6–5.1)
Alkaline phosphatase (APISO): 83 U/L (ref 37–153)
Bilirubin, Direct: 0.1 mg/dL (ref 0.0–0.2)
Globulin: 3.5 g/dL (calc) (ref 1.9–3.7)
Indirect Bilirubin: 0.5 mg/dL (calc) (ref 0.2–1.2)
Total Bilirubin: 0.6 mg/dL (ref 0.2–1.2)
Total Protein: 7.8 g/dL (ref 6.1–8.1)

## 2020-09-03 LAB — BASIC METABOLIC PANEL WITH GFR
BUN: 21 mg/dL (ref 7–25)
CO2: 23 mmol/L (ref 20–32)
Calcium: 10.3 mg/dL (ref 8.6–10.4)
Chloride: 101 mmol/L (ref 98–110)
Creat: 0.84 mg/dL (ref 0.50–1.05)
GFR, Est African American: 93 mL/min/{1.73_m2} (ref >=60)
GFR, Est Non African American: 80 mL/min/{1.73_m2} (ref >=60)
Glucose, Bld: 141 mg/dL — ABNORMAL HIGH (ref 65–99)
Potassium: 5 mmol/L (ref 3.5–5.3)
Sodium: 138 mmol/L (ref 135–146)

## 2020-09-03 LAB — HEMOGLOBIN A1C
Hgb A1c MFr Bld: 6.6 % of total Hgb — ABNORMAL HIGH (ref <5.7)
Mean Plasma Glucose: 143 (calc)
eAG (mmol/L): 7.9 (calc)

## 2020-09-03 LAB — EXTRA SPECIMEN

## 2020-09-03 LAB — LIPID PANEL
Cholesterol: 265 mg/dL — ABNORMAL HIGH (ref <200)
HDL: 74 mg/dL (ref >=50)
LDL Cholesterol (Calc): 169 mg/dL (calc) — ABNORMAL HIGH
Non-HDL Cholesterol (Calc): 191 mg/dL (calc) — ABNORMAL HIGH (ref <130)
Total CHOL/HDL Ratio: 3.6 (calc) (ref <5.0)
Triglycerides: 98 mg/dL (ref <150)

## 2020-09-03 LAB — TSH: TSH: 1.77 mIU/L

## 2020-09-03 LAB — CBC WITH DIFFERENTIAL/PLATELET

## 2020-09-06 ENCOUNTER — Telehealth: Payer: Self-pay

## 2020-09-06 NOTE — Telephone Encounter (Signed)
Health wellness form filled out/signed/faxed to [facility] @ [phone].   patient notified VIA phone and copy made and placed up front for her to pick up.   Dm/cma

## 2020-09-15 DIAGNOSIS — M25612 Stiffness of left shoulder, not elsewhere classified: Secondary | ICD-10-CM | POA: Diagnosis not present

## 2020-09-15 DIAGNOSIS — Z96612 Presence of left artificial shoulder joint: Secondary | ICD-10-CM | POA: Diagnosis not present

## 2020-09-15 DIAGNOSIS — R531 Weakness: Secondary | ICD-10-CM | POA: Diagnosis not present

## 2020-09-19 ENCOUNTER — Telehealth: Payer: Self-pay

## 2020-09-19 MED ORDER — ATORVASTATIN CALCIUM 10 MG PO TABS: 10.0000 mg | ORAL_TABLET | Freq: Every day | ORAL | 3 refills | Status: DC

## 2020-09-19 NOTE — Telephone Encounter (Signed)
-----   Message from Laurey Morale, MD sent at 09/06/2020 12:31 PM EST ----- The diabetes is well controlled but the cholesterol is a little high. Call in Lipitor 10 mg daily, #90 with 3 rf. Recheck lipids and A1c in 90 days

## 2020-09-19 NOTE — Telephone Encounter (Signed)
Attempted to reach patient but unable to leave message.  Lipitor sent to pharmacy.

## 2020-09-20 DIAGNOSIS — M25612 Stiffness of left shoulder, not elsewhere classified: Secondary | ICD-10-CM | POA: Diagnosis not present

## 2020-09-20 DIAGNOSIS — R531 Weakness: Secondary | ICD-10-CM | POA: Diagnosis not present

## 2020-09-20 DIAGNOSIS — Z96612 Presence of left artificial shoulder joint: Secondary | ICD-10-CM | POA: Diagnosis not present

## 2020-09-22 DIAGNOSIS — R531 Weakness: Secondary | ICD-10-CM | POA: Diagnosis not present

## 2020-09-22 DIAGNOSIS — Z96612 Presence of left artificial shoulder joint: Secondary | ICD-10-CM | POA: Diagnosis not present

## 2020-09-22 DIAGNOSIS — M25612 Stiffness of left shoulder, not elsewhere classified: Secondary | ICD-10-CM | POA: Diagnosis not present

## 2020-09-29 DIAGNOSIS — M25612 Stiffness of left shoulder, not elsewhere classified: Secondary | ICD-10-CM | POA: Diagnosis not present

## 2020-09-29 DIAGNOSIS — Z96612 Presence of left artificial shoulder joint: Secondary | ICD-10-CM | POA: Diagnosis not present

## 2020-09-29 DIAGNOSIS — R531 Weakness: Secondary | ICD-10-CM | POA: Diagnosis not present

## 2020-10-04 DIAGNOSIS — Z96612 Presence of left artificial shoulder joint: Secondary | ICD-10-CM | POA: Diagnosis not present

## 2020-10-04 DIAGNOSIS — M25612 Stiffness of left shoulder, not elsewhere classified: Secondary | ICD-10-CM | POA: Diagnosis not present

## 2020-10-04 DIAGNOSIS — R531 Weakness: Secondary | ICD-10-CM | POA: Diagnosis not present

## 2020-10-10 DIAGNOSIS — M25612 Stiffness of left shoulder, not elsewhere classified: Secondary | ICD-10-CM | POA: Diagnosis not present

## 2020-10-10 DIAGNOSIS — Z471 Aftercare following joint replacement surgery: Secondary | ICD-10-CM | POA: Diagnosis not present

## 2020-10-10 DIAGNOSIS — Z96612 Presence of left artificial shoulder joint: Secondary | ICD-10-CM | POA: Diagnosis not present

## 2020-10-10 DIAGNOSIS — R531 Weakness: Secondary | ICD-10-CM | POA: Diagnosis not present

## 2020-10-21 DIAGNOSIS — R531 Weakness: Secondary | ICD-10-CM | POA: Diagnosis not present

## 2020-10-21 DIAGNOSIS — M25612 Stiffness of left shoulder, not elsewhere classified: Secondary | ICD-10-CM | POA: Diagnosis not present

## 2020-10-21 DIAGNOSIS — Z96612 Presence of left artificial shoulder joint: Secondary | ICD-10-CM | POA: Diagnosis not present

## 2020-11-09 DIAGNOSIS — M25612 Stiffness of left shoulder, not elsewhere classified: Secondary | ICD-10-CM | POA: Diagnosis not present

## 2020-11-09 DIAGNOSIS — R531 Weakness: Secondary | ICD-10-CM | POA: Diagnosis not present

## 2020-11-09 DIAGNOSIS — Z96612 Presence of left artificial shoulder joint: Secondary | ICD-10-CM | POA: Diagnosis not present

## 2020-11-10 DIAGNOSIS — R531 Weakness: Secondary | ICD-10-CM | POA: Diagnosis not present

## 2020-11-10 DIAGNOSIS — M25612 Stiffness of left shoulder, not elsewhere classified: Secondary | ICD-10-CM | POA: Diagnosis not present

## 2020-11-10 DIAGNOSIS — Z96612 Presence of left artificial shoulder joint: Secondary | ICD-10-CM | POA: Diagnosis not present

## 2020-11-17 DIAGNOSIS — R531 Weakness: Secondary | ICD-10-CM | POA: Diagnosis not present

## 2020-11-17 DIAGNOSIS — Z96612 Presence of left artificial shoulder joint: Secondary | ICD-10-CM | POA: Diagnosis not present

## 2020-11-17 DIAGNOSIS — M25612 Stiffness of left shoulder, not elsewhere classified: Secondary | ICD-10-CM | POA: Diagnosis not present

## 2020-11-21 DIAGNOSIS — Z96612 Presence of left artificial shoulder joint: Secondary | ICD-10-CM | POA: Diagnosis not present

## 2020-11-21 DIAGNOSIS — Z471 Aftercare following joint replacement surgery: Secondary | ICD-10-CM | POA: Diagnosis not present

## 2021-07-24 IMAGING — CR DG CHEST 2V
2 series · 2 of 2 positions shown · non-contrast
Comparison: No priors.

CLINICAL DATA: 54-year-old female under preoperative evaluation.

EXAM:
CHEST - 2 VIEW

[w chest pa]
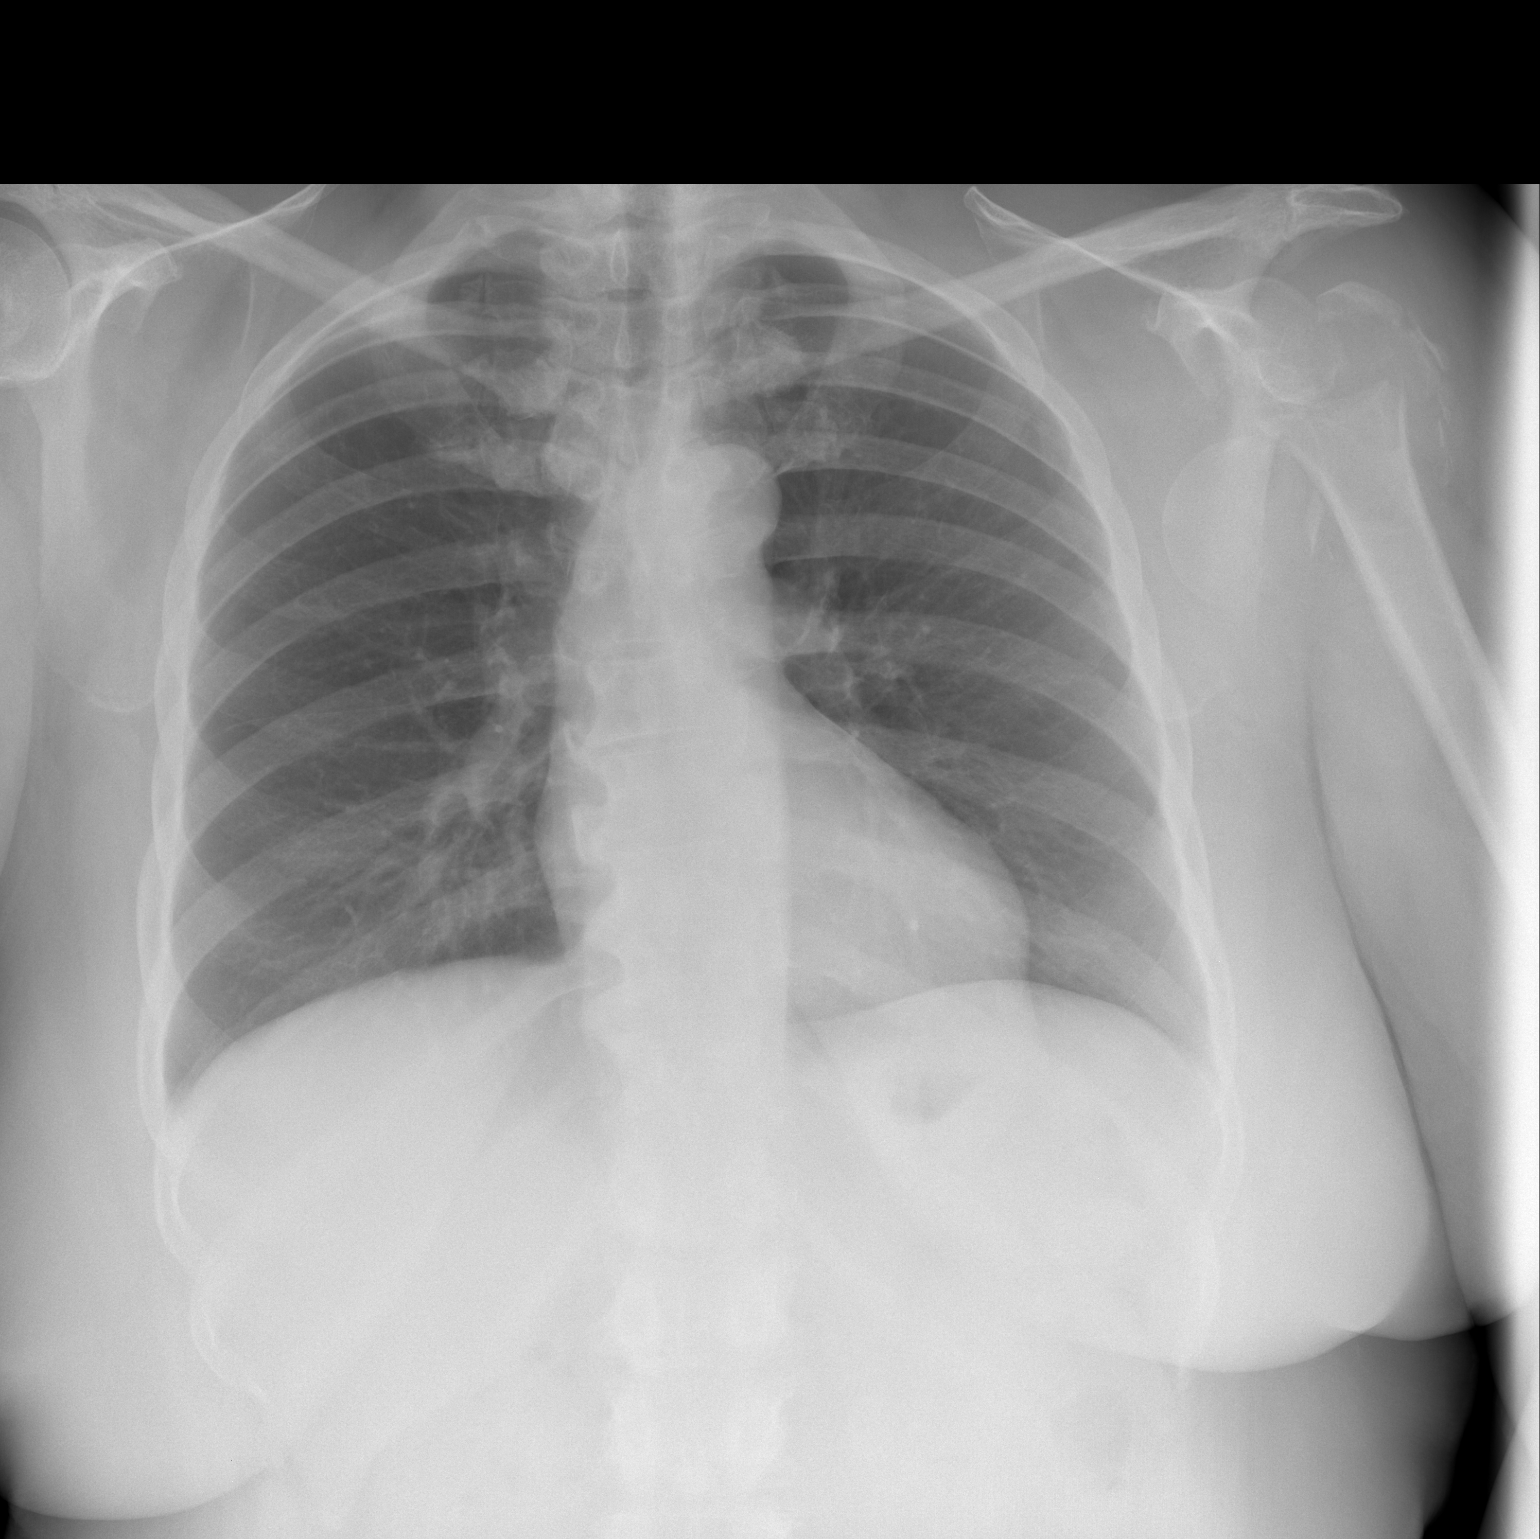

[w chest lat]
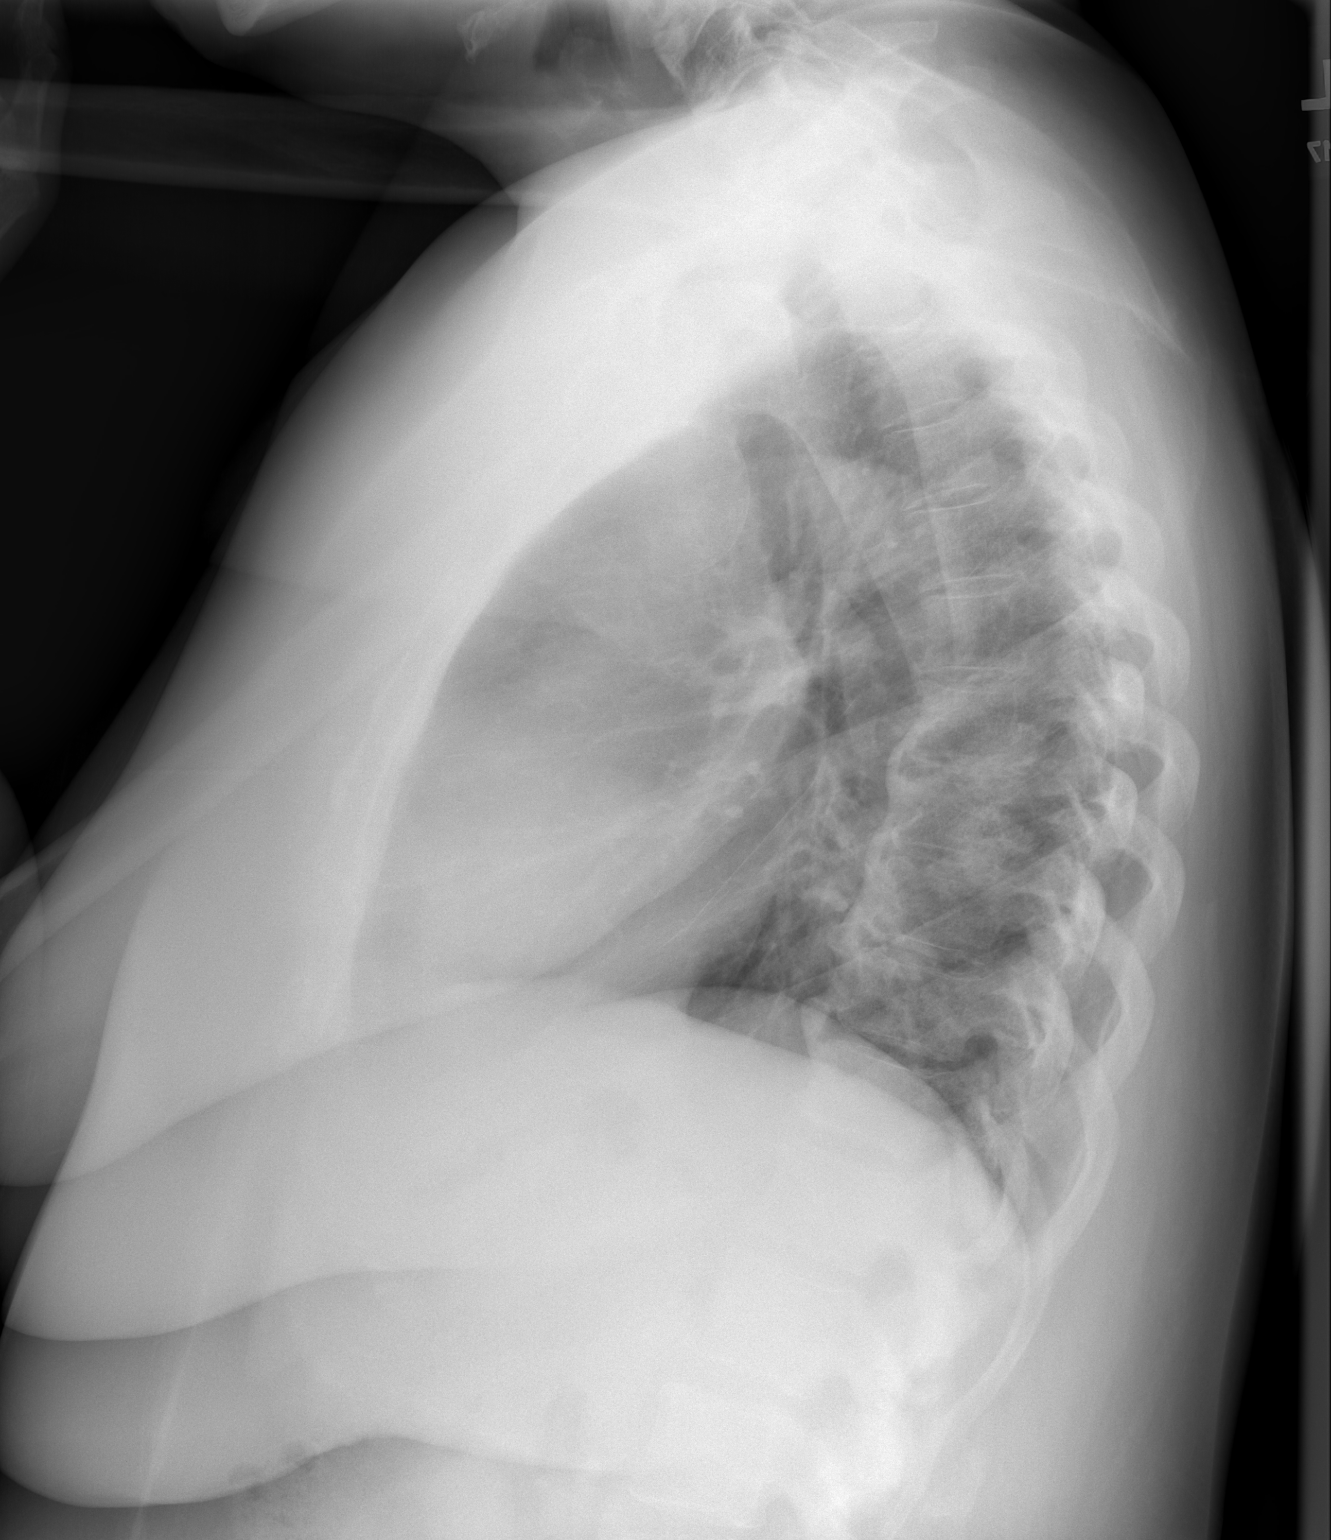

[2 of 2 positions shown; findings below may reference images not displayed]

FINDINGS: Lung volumes are normal. No consolidative airspace disease. No
pleural effusions. No pneumothorax. No pulmonary nodule or mass
noted. Pulmonary vasculature and the cardiomediastinal silhouette
are within normal limits. There is a highly comminuted fracture of
the left humeral head and neck with multiple free fragments, several
of which are the widely displaced, including a large fragment of the
left humeral head which lies inferior to the coracoid process.
IMPRESSION: 1.  No radiographic evidence of acute cardiopulmonary disease.
2. Comminuted age-indeterminate fracture of the left humeral
head/neck. Dedicated left shoulder radiographs are suggested if
clinically appropriate.

## 2021-07-27 IMAGING — DX DG SHOULDER 1V*L*
1 series · 1 of 1 positions shown · non-contrast
Comparison: None.

CLINICAL DATA: Status post reversed left total shoulder
arthroplasty.

EXAM:
LEFT SHOULDER

[shoulder ap]
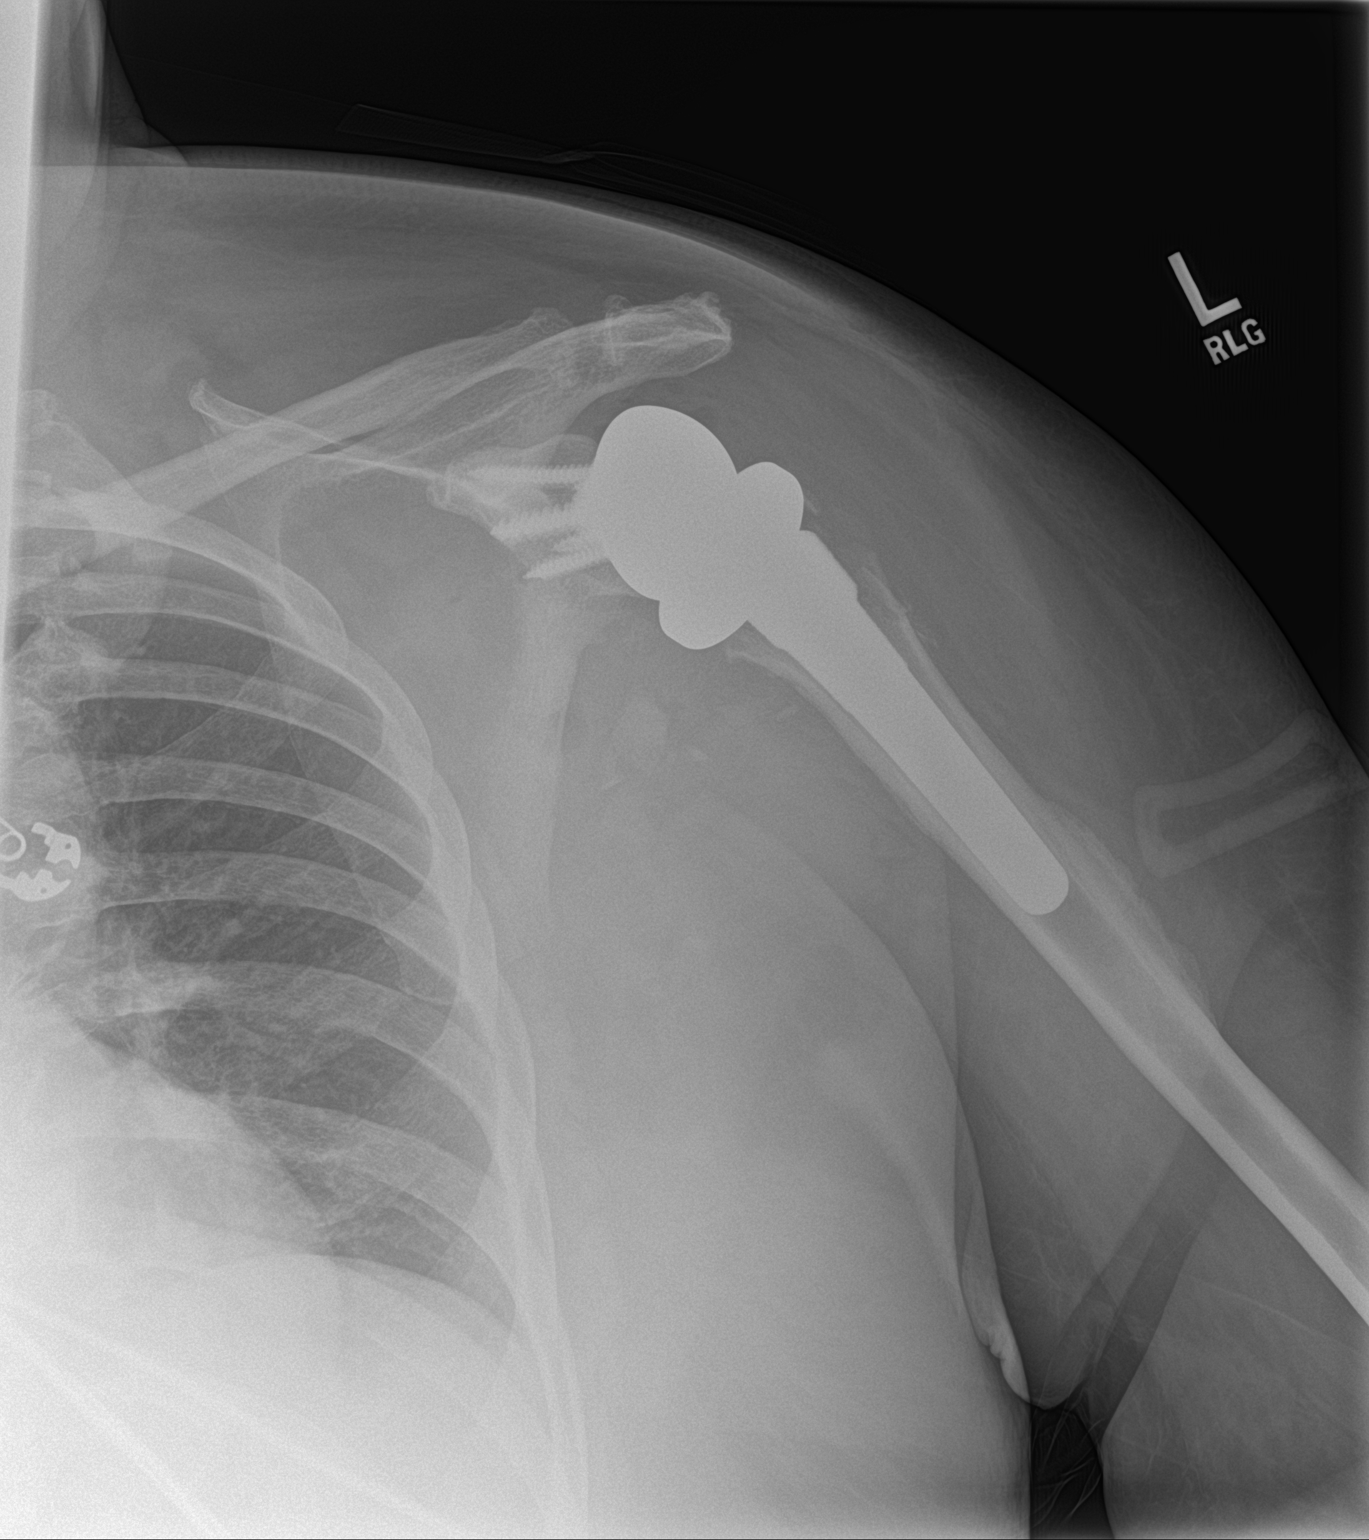

[1 of 1 positions shown; findings below may reference images not displayed]

FINDINGS: The glenoid and humeral components appear to be well situated. No
dislocation is noted.
IMPRESSION: Status post left total shoulder arthroplasty.

## 2021-09-11 ENCOUNTER — Encounter: Payer: BC Managed Care – PPO | Admitting: Family Medicine

## 2021-10-31 ENCOUNTER — Encounter: Payer: BC Managed Care – PPO | Admitting: Family Medicine

## 2022-02-08 ENCOUNTER — Other Ambulatory Visit: Payer: Self-pay | Admitting: Family Medicine

## 2022-02-19 ENCOUNTER — Encounter: Payer: BC Managed Care – PPO | Admitting: Family Medicine

## 2022-05-03 ENCOUNTER — Encounter: Payer: Self-pay | Admitting: Family Medicine

## 2022-05-03 ENCOUNTER — Ambulatory Visit (INDEPENDENT_AMBULATORY_CARE_PROVIDER_SITE_OTHER): Payer: BC Managed Care – PPO | Admitting: Family Medicine

## 2022-05-03 VITALS — BP 152/118 | HR 75 | Temp 98.4°F | Ht 66.25 in | Wt 198.4 lb

## 2022-05-03 DIAGNOSIS — Z0000 Encounter for general adult medical examination without abnormal findings: Secondary | ICD-10-CM | POA: Diagnosis not present

## 2022-05-03 LAB — LIPID PANEL
Cholesterol: 256 mg/dL — ABNORMAL HIGH (ref 0–200)
HDL: 91.2 mg/dL (ref >39.00)
LDL Cholesterol: 155 mg/dL — ABNORMAL HIGH (ref 0–99)
NonHDL: 164.41
Total CHOL/HDL Ratio: 3
Triglycerides: 48 mg/dL (ref 0.0–149.0)
VLDL: 9.6 mg/dL (ref 0.0–40.0)

## 2022-05-03 LAB — CBC WITH DIFFERENTIAL/PLATELET
Basophils Absolute: 0 10*3/uL (ref 0.0–0.1)
Basophils Relative: 0.9 % (ref 0.0–3.0)
Eosinophils Absolute: 0.2 10*3/uL (ref 0.0–0.7)
Eosinophils Relative: 3.5 % (ref 0.0–5.0)
HCT: 35 % — ABNORMAL LOW (ref 36.0–46.0)
Hemoglobin: 11.7 g/dL — ABNORMAL LOW (ref 12.0–15.0)
Lymphocytes Relative: 36.9 % (ref 12.0–46.0)
Lymphs Abs: 1.8 10*3/uL (ref 0.7–4.0)
MCHC: 33.4 g/dL (ref 30.0–36.0)
MCV: 95.8 fl (ref 78.0–100.0)
Monocytes Absolute: 0.6 10*3/uL (ref 0.1–1.0)
Monocytes Relative: 11.7 % (ref 3.0–12.0)
Neutro Abs: 2.3 10*3/uL (ref 1.4–7.7)
Neutrophils Relative %: 47 % (ref 43.0–77.0)
Platelets: 230 10*3/uL (ref 150.0–400.0)
RBC: 3.66 Mil/uL — ABNORMAL LOW (ref 3.87–5.11)
RDW: 12.5 % (ref 11.5–15.5)
WBC: 4.9 10*3/uL (ref 4.0–10.5)

## 2022-05-03 LAB — HEPATIC FUNCTION PANEL
ALT: 23 U/L (ref 0–35)
AST: 27 U/L (ref 0–37)
Albumin: 3.9 g/dL (ref 3.5–5.2)
Alkaline Phosphatase: 62 U/L (ref 39–117)
Bilirubin, Direct: 0.2 mg/dL (ref 0.0–0.3)
Total Bilirubin: 1 mg/dL (ref 0.2–1.2)
Total Protein: 7.2 g/dL (ref 6.0–8.3)

## 2022-05-03 LAB — BASIC METABOLIC PANEL
BUN: 13 mg/dL (ref 6–23)
CO2: 29 mEq/L (ref 19–32)
Calcium: 9.3 mg/dL (ref 8.4–10.5)
Chloride: 102 mEq/L (ref 96–112)
Creatinine, Ser: 0.66 mg/dL (ref 0.40–1.20)
GFR: 99.42 mL/min (ref >60.00)
Glucose, Bld: 108 mg/dL — ABNORMAL HIGH (ref 70–99)
Potassium: 3.2 mEq/L — ABNORMAL LOW (ref 3.5–5.1)
Sodium: 141 mEq/L (ref 135–145)

## 2022-05-03 LAB — TSH: TSH: 3.75 u[IU]/mL (ref 0.35–5.50)

## 2022-05-03 LAB — HEMOGLOBIN A1C: Hgb A1c MFr Bld: 5.9 % (ref 4.6–6.5)

## 2022-05-03 MED ORDER — AMLODIPINE BESYLATE 10 MG PO TABS: 10.0000 mg | ORAL_TABLET | Freq: Every day | ORAL | 3 refills | Status: DC

## 2022-05-03 MED ORDER — ATORVASTATIN CALCIUM 10 MG PO TABS: 10.0000 mg | ORAL_TABLET | Freq: Every day | ORAL | 3 refills | Status: DC

## 2022-05-03 MED ORDER — LISINOPRIL-HYDROCHLOROTHIAZIDE 20-12.5 MG PO TABS: 1.0000 | ORAL_TABLET | Freq: Two times a day (BID) | ORAL | 3 refills | Status: DC

## 2022-05-03 MED ORDER — METFORMIN HCL 500 MG PO TABS: 500.0000 mg | ORAL_TABLET | Freq: Two times a day (BID) | ORAL | 3 refills | Status: DC

## 2022-05-03 NOTE — Progress Notes (Signed)
   Subjective:    Patient ID: Jasmine Matthews, female    DOB: Aug 16, 1966, 56 y.o.   MRN: [id number]  HPI Here for a well exam. She feels well. She lost her job 2 years ago, and she was without health insurance. During this time she could not afford her medications, so she ran out. Now she has a new job, and she has insurance again. She is watching her diet and exercising.    Review of Systems  Constitutional: Negative.   HENT: Negative.    Eyes: Negative.   Respiratory: Negative.    Cardiovascular: Negative.   Gastrointestinal: Negative.   Genitourinary:  Negative for decreased urine volume, difficulty urinating, dyspareunia, dysuria, enuresis, flank pain, frequency, hematuria, pelvic pain and urgency.  Musculoskeletal: Negative.   Skin: Negative.   Neurological: Negative.  Negative for headaches.  Psychiatric/Behavioral: Negative.         Objective:   Physical Exam Constitutional:      General: She is not in acute distress.    Appearance: Normal appearance. She is well-developed.  HENT:     Head: Normocephalic and atraumatic.     Right Ear: External ear normal.     Left Ear: External ear normal.     Nose: Nose normal.     Mouth/Throat:     Pharynx: No oropharyngeal exudate.  Eyes:     General: No scleral icterus.    Conjunctiva/sclera: Conjunctivae normal.     Pupils: Pupils are equal, round, and reactive to light.  Neck:     Thyroid: No thyromegaly.     Vascular: No JVD.  Cardiovascular:     Rate and Rhythm: Normal rate and regular rhythm.     Heart sounds: Normal heart sounds. No murmur heard.    No friction rub. No gallop.  Pulmonary:     Effort: Pulmonary effort is normal. No respiratory distress.     Breath sounds: Normal breath sounds. No wheezing or rales.  Chest:     Chest wall: No tenderness.  Abdominal:     General: Bowel sounds are normal. There is no distension.     Palpations: Abdomen is soft. There is no mass.     Tenderness: There is no abdominal  tenderness. There is no guarding or rebound.  Musculoskeletal:        General: No tenderness. Normal range of motion.     Cervical back: Normal range of motion and neck supple.  Lymphadenopathy:     Cervical: No cervical adenopathy.  Skin:    General: Skin is warm and dry.     Findings: No erythema or rash.  Neurological:     Mental Status: She is alert and oriented to person, place, and time.     Cranial Nerves: No cranial nerve deficit.     Motor: No abnormal muscle tone.     Coordination: Coordination normal.     Deep Tendon Reflexes: Reflexes are normal and symmetric. Reflexes normal.  Psychiatric:        Behavior: Behavior normal.        Thought Content: Thought content normal.        Judgment: Judgment normal.           Assessment & Plan:  Well exam. We discussed diet and exercise. Get fasting labs. She will set up a mammogram and an eye exam soon.  Alysia Penna, MD

## 2022-05-24 ENCOUNTER — Other Ambulatory Visit: Payer: Self-pay

## 2022-05-24 MED ORDER — POTASSIUM CHLORIDE ER 10 MEQ PO TBCR: 10.0000 meq | EXTENDED_RELEASE_TABLET | Freq: Two times a day (BID) | ORAL | 3 refills | Status: AC

## 2022-05-24 MED ORDER — ATORVASTATIN CALCIUM 40 MG PO TABS: 40.0000 mg | ORAL_TABLET | Freq: Every day | ORAL | 3 refills | Status: AC

## 2023-02-21 ENCOUNTER — Ambulatory Visit: Payer: BC Managed Care – PPO | Admitting: Family Medicine

## 2023-07-26 ENCOUNTER — Other Ambulatory Visit: Payer: Self-pay | Admitting: Family Medicine
